# Patient Record
Sex: Male | Born: 1960 | Race: White | Hispanic: No | Marital: Married | Smoking: Former smoker
Health system: Southern US, Community
[De-identification: ages and names within clinical notes are randomized; demographics above are authoritative.]

## PROBLEM LIST (undated history)

## (undated) DIAGNOSIS — I10 Essential (primary) hypertension: Secondary | ICD-10-CM

## (undated) DIAGNOSIS — IMO0002 Reserved for concepts with insufficient information to code with codable children: Secondary | ICD-10-CM

## (undated) DIAGNOSIS — M722 Plantar fascial fibromatosis: Secondary | ICD-10-CM

## (undated) DIAGNOSIS — G610 Guillain-Barre syndrome: Secondary | ICD-10-CM

## (undated) DIAGNOSIS — K219 Gastro-esophageal reflux disease without esophagitis: Secondary | ICD-10-CM

## (undated) HISTORY — DX: Essential (primary) hypertension: I10

## (undated) HISTORY — DX: Guillain-Barre syndrome: G61.0

## (undated) HISTORY — DX: Plantar fascial fibromatosis: M72.2

## (undated) HISTORY — DX: Reserved for concepts with insufficient information to code with codable children: IMO0002

## (undated) HISTORY — DX: Gastro-esophageal reflux disease without esophagitis: K21.9

## (undated) HISTORY — PX: OTHER SURGICAL HISTORY: SHX169

---
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
## 2001-09-16 ENCOUNTER — Encounter: Payer: Self-pay | Admitting: Emergency Medicine

## 2001-09-16 ENCOUNTER — Emergency Department (HOSPITAL_COMMUNITY): Admission: AC | Admit: 2001-09-16 | Discharge: 2001-09-16 | Payer: Self-pay

## 2011-08-23 ENCOUNTER — Emergency Department (HOSPITAL_COMMUNITY)
Admission: EM | Admit: 2011-08-23 | Discharge: 2011-08-23 | Disposition: A | Discharge status: Home / Self Care | Payer: Self-pay | Attending: Emergency Medicine | Admitting: Emergency Medicine

## 2011-08-23 ENCOUNTER — Encounter: Payer: Self-pay | Admitting: Emergency Medicine

## 2011-08-23 DIAGNOSIS — K529 Noninfective gastroenteritis and colitis, unspecified: Secondary | ICD-10-CM

## 2011-08-23 DIAGNOSIS — K625 Hemorrhage of anus and rectum: Secondary | ICD-10-CM | POA: Insufficient documentation

## 2011-08-23 DIAGNOSIS — E876 Hypokalemia: Secondary | ICD-10-CM | POA: Insufficient documentation

## 2011-08-23 DIAGNOSIS — K5289 Other specified noninfective gastroenteritis and colitis: Secondary | ICD-10-CM | POA: Insufficient documentation

## 2011-08-23 DIAGNOSIS — R509 Fever, unspecified: Secondary | ICD-10-CM | POA: Insufficient documentation

## 2011-08-23 DIAGNOSIS — Z79899 Other long term (current) drug therapy: Secondary | ICD-10-CM | POA: Insufficient documentation

## 2011-08-23 DIAGNOSIS — R197 Diarrhea, unspecified: Secondary | ICD-10-CM | POA: Insufficient documentation

## 2011-08-23 LAB — DIFFERENTIAL
Basophils Relative: 0 % (ref 0–1)
Monocytes Absolute: 0.7 10*3/uL (ref 0.1–1.0)
Monocytes Relative: 13 % — ABNORMAL HIGH (ref 3–12)
Neutro Abs: 3.6 10*3/uL (ref 1.7–7.7)

## 2011-08-23 LAB — CBC
HCT: 40.1 % (ref 39.0–52.0)
Hemoglobin: 13.8 g/dL (ref 13.0–17.0)
MCH: 29.7 pg (ref 26.0–34.0)
MCHC: 34.4 g/dL (ref 30.0–36.0)
MCV: 86.4 fL (ref 78.0–100.0)
Platelets: 188 10*3/uL (ref 150–400)
RBC: 4.64 MIL/uL (ref 4.22–5.81)
RDW: 13 % (ref 11.5–15.5)
WBC: 5.6 10*3/uL (ref 4.0–10.5)

## 2011-08-23 LAB — BASIC METABOLIC PANEL
BUN: 11 mg/dL (ref 6–23)
CO2: 27 mEq/L (ref 19–32)
Chloride: 98 mEq/L (ref 96–112)
Creatinine, Ser: 0.73 mg/dL (ref 0.50–1.35)
GFR calc Af Amer: >90 mL/min (ref >90)

## 2011-08-23 MED ORDER — ONDANSETRON HCL 4 MG/2ML IJ SOLN
4.0000 mg | Freq: Once | INTRAMUSCULAR | Status: DC
  Filled 2011-08-23: qty 2

## 2011-08-23 MED ORDER — SODIUM CHLORIDE 0.9 % IV BOLUS (SEPSIS)
1000.0000 mL | Freq: Once | INTRAVENOUS | Status: AC
  Administered 2011-08-23: 1000 mL via INTRAVENOUS

## 2011-08-23 MED ORDER — POTASSIUM CHLORIDE CRYS ER 20 MEQ PO TBCR
20.0000 meq | EXTENDED_RELEASE_TABLET | Freq: Once | ORAL | Status: AC
  Administered 2011-08-23: 20 meq via ORAL
  Filled 2011-08-23: qty 1

## 2011-08-23 MED ORDER — DIPHENOXYLATE-ATROPINE 2.5-0.025 MG PO TABS: 2.0000 | ORAL_TABLET | Freq: Four times a day (QID) | ORAL | Status: AC | PRN

## 2011-08-23 MED ORDER — DIPHENOXYLATE-ATROPINE 2.5-0.025 MG PO TABS
2.0000 | ORAL_TABLET | Freq: Once | ORAL | Status: AC
  Administered 2011-08-23: 2 via ORAL
  Filled 2011-08-23: qty 2

## 2011-08-23 NOTE — ED Provider Notes (Signed)
History     CSN: [id number] Arrival date & time: 08/23/2011  8:41 AM  Chief Complaint  Patient presents with  . Rectal Bleeding  . Diarrhea  . Fever  . Chills    (Consider location/radiation/quality/duration/timing/severity/associated sxs/prior treatment) Patient is a 50 y.o. male presenting with hematochezia, diarrhea, and fever. The history is provided by the patient and the spouse.  Rectal Bleeding  The onset was gradual. The problem occurs frequently (He reports frequent small volume brown liquid diarrhea (occuring about every 15 minutes) along with left lower abdominal cramping for the past 5 days.  He has had intermittent fever,  reporting fever to 104 yesterday. ). The problem has been unchanged. The pain is mild. The stool is described as liquid (since last night has seen a few episodes of blood clots in his stool.). Associated symptoms include a fever and diarrhea. Pertinent negatives include no abdominal pain, no hematemesis, no nausea, no rectal pain, no vomiting, no chest pain, no headaches and no rash. Associated symptoms comments: He reports being hungry,  But has been reluctant to eat as this will trigger diarrhea.  He has been maintaining adequate fluid intake with sports drinks.. The diarrhea occurs more than 10 times per day. The diarrhea is watery. His past medical history does not include abdominal surgery, inflammatory bowel disease, recent abdominal injury, recent antibiotic use, recent change in diet or a recent illness. There were no sick contacts. He has received no recent medical care.  Diarrhea The primary symptoms include fever, diarrhea and hematochezia. Primary symptoms do not include abdominal pain, nausea, vomiting, hematemesis, arthralgias or rash.  Associated medical issues do not include inflammatory bowel disease.  Fever Primary symptoms of the febrile illness include fever and diarrhea. Primary symptoms do not include headaches, shortness of breath, abdominal  pain, nausea, vomiting, arthralgias or rash.    History reviewed. No pertinent past medical history.  History reviewed. No pertinent past surgical history.  History reviewed. No pertinent family history.  History  Substance Use Topics  . Smoking status: Not on file  . Smokeless tobacco: Not on file  . Alcohol Use: No      Review of Systems  Constitutional: Positive for fever.  HENT: Negative for congestion, sore throat and neck pain.   Eyes: Negative.   Respiratory: Negative for chest tightness and shortness of breath.   Cardiovascular: Negative for chest pain.  Gastrointestinal: Positive for diarrhea and hematochezia. Negative for nausea, vomiting, abdominal pain, rectal pain and hematemesis.  Genitourinary: Negative.   Musculoskeletal: Negative for joint swelling and arthralgias.  Skin: Negative.  Negative for rash and wound.  Neurological: Negative for dizziness, weakness, light-headedness, numbness and headaches.  Hematological: Negative.   Psychiatric/Behavioral: Negative.     Allergies  Review of patient's allergies indicates no known allergies.  Home Medications   Current Outpatient Rx  Name Route Sig Dispense Refill  . DM-PHENYLEPHRINE-ACETAMINOPHEN 10-5-325 MG/15ML PO LIQD Oral Take 15 mLs by mouth 2 (two) times daily as needed. Cold Symptoms     . ECHINACEA EXTRACT PO Oral Take 1 capsule by mouth daily as needed. Used to try help cold symptoms     . IBUPROFEN 200 MG PO TABS Oral Take 600-800 mg by mouth every 6 (six) hours as needed. Pain     . DIPHENOXYLATE-ATROPINE 2.5-0.025 MG PO TABS Oral Take 2 tablets by mouth 4 (four) times daily as needed for diarrhea/loose stools. 12 tablet 0    BP 128/85  Pulse 84  Temp(Src) 98.6 F (  37 C) (Oral)  Resp 20  Ht 5\' 9"  (1.753 m)  Wt 195 lb (88.451 kg)  BMI 28.80 kg/m2  SpO2 95%  Physical Exam  Nursing note and vitals reviewed. Constitutional: He is oriented to person, place, and time. He appears well-developed  and well-nourished.  HENT:  Head: Normocephalic and atraumatic.  Eyes: Conjunctivae are normal.  Neck: Normal range of motion.  Cardiovascular: Normal rate, regular rhythm, normal heart sounds and intact distal pulses.   Pulmonary/Chest: Effort normal and breath sounds normal. He has no wheezes.  Abdominal: Soft. Bowel sounds are normal. He exhibits no distension. There is tenderness. There is no rebound and no guarding.       Slight discomfort llq without guarding or rebound.  Musculoskeletal: Normal range of motion.  Neurological: He is alert and oriented to person, place, and time.  Skin: Skin is warm and dry.  Psychiatric: He has a normal mood and affect.    ED Course  Procedures (including critical care time)  Labs Reviewed  DIFFERENTIAL - Abnormal; Notable for the following:    Monocytes Relative 13 (*)    All other components within normal limits  BASIC METABOLIC PANEL - Abnormal; Notable for the following:    Sodium 133 (*)    Potassium 3.2 (*)    Glucose, Bld 129 (*)    All other components within normal limits  CBC  CLOSTRIDIUM DIFFICILE EIA  STOOL CULTURE  OVA AND PARASITE EXAMINATION   No results found.   1. Hypokalemia, gastrointestinal losses   2. Enteritis       MDM  O & P,  Stool cultures pending, c dif pending.  Lomotil prn diarrhea.  Patient without further diarrhea while in dept after initial sample for lab testing obtained.  IV NS 1 liter given,  Patient felt better at time of dc.  Encourage close f/u.  If still with sx in 48 hours,  Patient will call for culture results and/or possible re-eval here vs abx called in given positive cultures. B.r.a.t. Diet for the next 24 hours.        Candis Musa, PA 08/23/11 1705

## 2011-08-23 NOTE — ED Notes (Signed)
Pt c/o diarrhea since Friday. States that it had blood in it last night. C/o soreness in his lower abdomen. Denies nausea and vomiting. Pt had a small dark Patrick Wood BM with mucous but no blood seen. Alert and oriented x 3. Skin warm and dry. Color pink. Breath sounds clear and equal bilaterally. Abdomen soft and non distended. Denies tenderness. Also c/o red rash on pubic area.

## 2011-08-23 NOTE — ED Notes (Signed)
Pt c/o fever, diarrhea and rectal bleeding since Friday.

## 2011-08-24 LAB — OVA AND PARASITE EXAMINATION

## 2011-08-25 NOTE — ED Provider Notes (Signed)
Medical screening examination/treatment/procedure(s) were performed by non-physician practitioner and as supervising physician I was immediately available for consultation/collaboration.  Nicholes Stairs, MD 08/25/11 [phone]

## 2011-08-26 NOTE — ED Notes (Signed)
Unable to reach pt per phone to see if pt improved (per Dr Ethelda Chick); left call back number.

## 2011-08-26 NOTE — ED Notes (Signed)
Lab called + salamella on stool specimen. Chart sent to EDP office for review

## 2011-09-14 LAB — STOOL CULTURE

## 2017-07-03 ENCOUNTER — Encounter: Payer: Self-pay | Admitting: Physician Assistant

## 2017-07-03 ENCOUNTER — Ambulatory Visit (INDEPENDENT_AMBULATORY_CARE_PROVIDER_SITE_OTHER): Payer: BLUE CROSS/BLUE SHIELD | Admitting: Physician Assistant

## 2017-07-03 VITALS — BP 128/90 | HR 96 | Temp 98.6°F | Resp 14 | Ht 70.0 in | Wt 201.0 lb

## 2017-07-03 DIAGNOSIS — R1310 Dysphagia, unspecified: Secondary | ICD-10-CM | POA: Diagnosis not present

## 2017-07-03 DIAGNOSIS — K219 Gastro-esophageal reflux disease without esophagitis: Secondary | ICD-10-CM | POA: Diagnosis not present

## 2017-07-03 DIAGNOSIS — Z8042 Family history of malignant neoplasm of prostate: Secondary | ICD-10-CM

## 2017-07-03 DIAGNOSIS — R319 Hematuria, unspecified: Secondary | ICD-10-CM | POA: Diagnosis not present

## 2017-07-03 LAB — COMPREHENSIVE METABOLIC PANEL
ALK PHOS: 52 U/L (ref 39–117)
ALT: 57 U/L — ABNORMAL HIGH (ref 0–53)
AST: 27 U/L (ref 0–37)
Albumin: 4.6 g/dL (ref 3.5–5.2)
BUN: 16 mg/dL (ref 6–23)
CALCIUM: 9.8 mg/dL (ref 8.4–10.5)
CO2: 28 meq/L (ref 19–32)
Chloride: 103 mEq/L (ref 96–112)
Creatinine, Ser: 0.93 mg/dL (ref 0.40–1.50)
GFR: 89.3 mL/min (ref >60.00)
GLUCOSE: 157 mg/dL — AB (ref 70–99)
POTASSIUM: 3.7 meq/L (ref 3.5–5.1)
Sodium: 140 mEq/L (ref 135–145)
TOTAL PROTEIN: 7 g/dL (ref 6.0–8.3)
Total Bilirubin: 1 mg/dL (ref 0.2–1.2)

## 2017-07-03 LAB — CBC WITH DIFFERENTIAL/PLATELET
Basophils Absolute: 0 10*3/uL (ref 0.0–0.1)
Basophils Relative: 0.7 % (ref 0.0–3.0)
EOS PCT: 8.2 % — AB (ref 0.0–5.0)
Eosinophils Absolute: 0.4 10*3/uL (ref 0.0–0.7)
HCT: 46 % (ref 39.0–52.0)
Hemoglobin: 15.7 g/dL (ref 13.0–17.0)
LYMPHS ABS: 1.6 10*3/uL (ref 0.7–4.0)
Lymphocytes Relative: 32.5 % (ref 12.0–46.0)
MCHC: 34 g/dL (ref 30.0–36.0)
MCV: 89.4 fl (ref 78.0–100.0)
MONOS PCT: 7.5 % (ref 3.0–12.0)
Monocytes Absolute: 0.4 10*3/uL (ref 0.1–1.0)
NEUTROS ABS: 2.5 10*3/uL (ref 1.4–7.7)
NEUTROS PCT: 51.1 % (ref 43.0–77.0)
PLATELETS: 221 10*3/uL (ref 150.0–400.0)
RBC: 5.15 Mil/uL (ref 4.22–5.81)
RDW: 13.1 % (ref 11.5–15.5)
WBC: 4.9 10*3/uL (ref 4.0–10.5)

## 2017-07-03 LAB — PSA: PSA: 2.87 ng/mL (ref 0.10–4.00)

## 2017-07-03 LAB — POCT URINALYSIS DIPSTICK
Bilirubin, UA: NEGATIVE
Blood, UA: NEGATIVE
Nitrite, UA: NEGATIVE
PH UA: 7 (ref 5.0–8.0)
PROTEIN UA: NEGATIVE
SPEC GRAV UA: 1.015 (ref 1.010–1.025)
UROBILINOGEN UA: 0.2 U/dL

## 2017-07-03 LAB — LIPASE: LIPASE: 41 U/L (ref 11.0–59.0)

## 2017-07-03 LAB — H. PYLORI ANTIBODY, IGG: H Pylori IgG: NEGATIVE

## 2017-07-03 MED ORDER — PANTOPRAZOLE SODIUM 40 MG PO TBEC: 40.0000 mg | DELAYED_RELEASE_TABLET | Freq: Every day | ORAL | 3 refills | Status: DC

## 2017-07-03 NOTE — Progress Notes (Signed)
Patient presents to clinic today to establish care.  Acute Concerns: Endorses a couple of episodes of hematuria occurring 2 weeks ago. Denies dysuria, urinary urgency or frequency. Endorses slowed stream with hesitancy over the past 1-2 years. Denies post-void dribbling. Endorses nocturia x 1. Patient with + family history of prostate cancer --brother diagnosed at age 56.   Chronic Issues: GERD -- Patient with a long-standing history (5+ years). Symptoms include -- heart burn, reflux, bloating, abdominal cramping, nausea (with certain meals). Does note chest pressure or discomfort with eating larger meals. Notes some occasional dysphagia with solid foods. Denies change in bowel habits. Endorses being regular with bowel movements. Denies melena, hematochezia or tenesmus.   Health Maintenance: Immunizations -- Will obtain records from previous PCP for review.  Colonoscopy -- Has never had. Denies family history of colorectal cancer.   Past Medical History:  Diagnosis Date  . GERD (gastroesophageal reflux disease)    Past Surgical History:  Procedure Laterality Date  . WRIST FRACTURE SURGERY     No current outpatient prescriptions on file prior to visit.   No current facility-administered medications on file prior to visit.     No Known Allergies  Family History  Problem Relation Age of Onset  . Cancer Mother        Liver  . Hypertension Mother   . Asthma Mother   . Cancer Brother 58       Prostate    Social History   Social History  . Marital status: Married    Spouse name: N/A  . Number of children: N/A  . Years of education: N/A   Occupational History  . Not on file.   Social History Main Topics  . Smoking status: Never Smoker  . Smokeless tobacco: Never Used  . Alcohol use No  . Drug use: No  . Sexual activity: Yes   Other Topics Concern  . Not on file   Social History Narrative  . No narrative on file   Review of Systems  Constitutional: Positive  for weight loss. Negative for fever.  Eyes: Negative for blurred vision and double vision.  Respiratory: Negative for shortness of breath.   Cardiovascular: Negative for chest pain and palpitations.  Gastrointestinal: Positive for abdominal pain, heartburn and nausea. Negative for blood in stool, constipation, diarrhea, melena and vomiting.  Genitourinary: Positive for hematuria. Negative for dysuria, flank pain, frequency and urgency.       Nocturia x 1.  + urinary hesitancy  Neurological: Negative for dizziness.  Psychiatric/Behavioral: Negative for depression and memory loss. The patient does not have insomnia.    BP 128/90   Pulse 96   Temp 98.6 F (37 C) (Oral)   Resp 14   Ht 5\' 10"  (1.778 m)   Wt 201 lb (91.2 kg)   SpO2 94%   BMI 28.84 kg/m   Physical Exam  Constitutional: He is oriented to person, place, and time and well-developed, well-nourished, and in no distress.  HENT:  Head: Normocephalic and atraumatic.  Eyes: Conjunctivae are normal.  Cardiovascular: Normal rate, regular rhythm, normal heart sounds and intact distal pulses.   Pulmonary/Chest: Effort normal and breath sounds normal. No respiratory distress. He has no wheezes. He has no rales. He exhibits no tenderness.  Abdominal: Soft. Bowel sounds are normal. He exhibits no distension and no mass. There is no tenderness. There is no rebound and no guarding.  Genitourinary: Rectum normal. Prostate is enlarged. Prostate is not tender.  Neurological: He is  alert and oriented to person, place, and time.  Skin: Skin is warm and dry. No rash noted.  Vitals reviewed.  Assessment/Plan: Hematuria One potential episode a few weeks prior. Otherwise asymptomatic. Urine dip unremarkable for blood/hemoglobin. Will are assessing PSA today giving enlarged PSA on exam and symptoms of BPH. Will likely get urology referral once results are in giving family history.   Gastroesophageal reflux disease without esophagitis With some  episodes of esophagitis. Seems to be concern for a stricture as well. Will start Protonix. Labs obtained today to include H. Pylori testing. GERD diet to be started. Referral to GI placed.   Dysphagia With GERd. Solids only. Occasional. Concern for stricture. Referral to GI placed for EGD and further assessment. Discussed diet. Patient started on PPI for GERD symptoms.   Family history of prostate cancer Patient with urinary hesitancy. DRE performed today revealing enlargement of prostate without loss of central groove. PSA to be obtained today. Will make appropriate referral to Urology if indicated. Will also consider addition of Flomax if workup unremarkable.     Piedad ClimesMartin, Cartez Mogle Cody, PA-C

## 2017-07-03 NOTE — Assessment & Plan Note (Signed)
With some episodes of esophagitis. Seems to be concern for a stricture as well. Will start Protonix. Labs obtained today to include H. Pylori testing. GERD diet to be started. Referral to GI placed.

## 2017-07-03 NOTE — Assessment & Plan Note (Signed)
One potential episode a few weeks prior. Otherwise asymptomatic. Urine dip unremarkable for blood/hemoglobin. Will are assessing PSA today giving enlarged PSA on exam and symptoms of BPH. Will likely get urology referral once results are in giving family history.

## 2017-07-03 NOTE — Assessment & Plan Note (Signed)
Patient with urinary hesitancy. DRE performed today revealing enlargement of prostate without loss of central groove. PSA to be obtained today. Will make appropriate referral to Urology if indicated. Will also consider addition of Flomax if workup unremarkable.

## 2017-07-03 NOTE — Progress Notes (Signed)
Pre visit review using our clinic review tool, if applicable. No additional management support is needed unless otherwise documented below in the visit note. 

## 2017-07-03 NOTE — Patient Instructions (Addendum)
Please go to the lab for blood work. I will call with your results.  Please start the Protonix to help with symptoms. It will take a few days to take a fuller effect. Start an over-the-counter Zantac twice daily for a couple of days to help in the mean time. Please follow the diet below.   I am setting you up with Gastroenterology for a more in-depth assessment of swallowing issues and to help with other gastric issues if labs are unremarkable.   I am also checking prostate tests today giving your current symptoms and family history.  We will schedule a follow-up when I call with lab results.   Food Choices for Gastroesophageal Reflux Disease, Adult When you have gastroesophageal reflux disease (GERD), the foods you eat and your eating habits are very important. Choosing the right foods can help ease your discomfort. What guidelines do I need to follow?  Choose fruits, vegetables, whole grains, and low-fat dairy products.  Choose low-fat meat, fish, and poultry.  Limit fats such as oils, salad dressings, butter, nuts, and avocado.  Keep a food diary. This helps you identify foods that cause symptoms.  Avoid foods that cause symptoms. These may be different for everyone.  Eat small meals often instead of 3 large meals a day.  Eat your meals slowly, in a place where you are relaxed.  Limit fried foods.  Cook foods using methods other than frying.  Avoid drinking alcohol.  Avoid drinking large amounts of liquids with your meals.  Avoid bending over or lying down until 2-3 hours after eating. What foods are not recommended? These are some foods and drinks that may make your symptoms worse: Vegetables Tomatoes. Tomato juice. Tomato and spaghetti sauce. Chili peppers. Onion and garlic. Horseradish. Fruits Oranges, grapefruit, and lemon (fruit and juice). Meats High-fat meats, fish, and poultry. This includes hot dogs, ribs, ham, sausage, salami, and bacon. Dairy Whole milk  and chocolate milk. Sour cream. Cream. Butter. Ice cream. Cream cheese. Drinks Coffee and tea. Bubbly (carbonated) drinks or energy drinks. Condiments Hot sauce. Barbecue sauce. Sweets/Desserts Chocolate and cocoa. Donuts. Peppermint and spearmint. Fats and Oils High-fat foods. This includes Jamaica[place] fries and potato chips. Other Vinegar. Strong spices. This includes black pepper, white pepper, red pepper, cayenne, curry powder, cloves, ginger, and chili powder. The items listed above may not be a complete list of foods and drinks to avoid. Contact your dietitian for more information. This information is not intended to replace advice given to you by your health care provider. Make sure you discuss any questions you have with your health care provider. Document Released: 05/07/2012 Document Revised: 04/13/2016 Document Reviewed: 09/10/2013 Elsevier Interactive Patient Education  2017 [employer]

## 2017-07-03 NOTE — Assessment & Plan Note (Signed)
With GERd. Solids only. Occasional. Concern for stricture. Referral to GI placed for EGD and further assessment. Discussed diet. Patient started on PPI for GERD symptoms.

## 2017-07-04 ENCOUNTER — Encounter: Payer: Self-pay | Admitting: Gastroenterology

## 2017-07-04 ENCOUNTER — Other Ambulatory Visit: Payer: Self-pay | Admitting: Physician Assistant

## 2017-07-04 DIAGNOSIS — K219 Gastro-esophageal reflux disease without esophagitis: Secondary | ICD-10-CM

## 2017-07-04 DIAGNOSIS — Z8042 Family history of malignant neoplasm of prostate: Secondary | ICD-10-CM

## 2017-07-04 DIAGNOSIS — N138 Other obstructive and reflux uropathy: Secondary | ICD-10-CM

## 2017-07-04 DIAGNOSIS — R1310 Dysphagia, unspecified: Secondary | ICD-10-CM

## 2017-07-04 DIAGNOSIS — N401 Enlarged prostate with lower urinary tract symptoms: Secondary | ICD-10-CM

## 2017-07-20 ENCOUNTER — Other Ambulatory Visit: Payer: Self-pay | Admitting: Urology

## 2017-07-20 ENCOUNTER — Ambulatory Visit (INDEPENDENT_AMBULATORY_CARE_PROVIDER_SITE_OTHER): Payer: BLUE CROSS/BLUE SHIELD | Admitting: Urology

## 2017-07-20 DIAGNOSIS — R310 Gross hematuria: Secondary | ICD-10-CM

## 2017-07-20 DIAGNOSIS — R972 Elevated prostate specific antigen [PSA]: Secondary | ICD-10-CM

## 2017-07-30 ENCOUNTER — Ambulatory Visit: Payer: BLUE CROSS/BLUE SHIELD | Admitting: Physician Assistant

## 2017-08-13 ENCOUNTER — Encounter: Payer: Self-pay | Admitting: Gastroenterology

## 2017-08-13 ENCOUNTER — Ambulatory Visit (INDEPENDENT_AMBULATORY_CARE_PROVIDER_SITE_OTHER): Payer: BLUE CROSS/BLUE SHIELD | Admitting: Gastroenterology

## 2017-08-13 VITALS — BP 136/78 | HR 76 | Ht 69.0 in | Wt 206.1 lb

## 2017-08-13 DIAGNOSIS — K219 Gastro-esophageal reflux disease without esophagitis: Secondary | ICD-10-CM

## 2017-08-13 DIAGNOSIS — Z1211 Encounter for screening for malignant neoplasm of colon: Secondary | ICD-10-CM
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
## 2017-08-13 NOTE — Patient Instructions (Signed)
If you are age 56 or older, your body mass index should be between 23-30. Your Body mass index is 30.44 kg/m. If this is out of the aforementioned range listed, please consider follow up with your Primary Care Provider.  If you are age 28 or younger, your body mass index should be between 19-25. Your Body mass index is 30.44 kg/m. If this is out of the aformentioned range listed, please consider follow up with your Primary Care Provider.   We have sent the following medications to your pharmacy for you to pick up at your convenience:  Pantoprazole  You have been scheduled for an endoscopy. Please follow written instructions given to you at your visit today. If you use inhalers (even only as needed), please bring them with you on the day of your procedure. Your physician has requested that you go to www.startemmi.com and enter the access code given to you at your visit today. This web site gives a general overview about your procedure. However, you should still follow specific instructions given to you by our office regarding your preparation for the procedure.  Thank you.

## 2017-08-21 ENCOUNTER — Encounter: Payer: Self-pay | Admitting: Gastroenterology

## 2017-08-28 ENCOUNTER — Telehealth: Payer: Self-pay | Admitting: Gastroenterology

## 2017-08-29 MED ORDER — PANTOPRAZOLE SODIUM 40 MG PO TBEC: 40.0000 mg | DELAYED_RELEASE_TABLET | Freq: Every day | ORAL | 1 refills | Status: DC

## 2017-08-29 NOTE — Telephone Encounter (Signed)
done

## 2017-08-29 NOTE — Telephone Encounter (Signed)
Refill request for pantoprazole 40 mg one a day. 08-13-2017 last seen.

## 2017-08-29 NOTE — Addendum Note (Signed)
Addended by: Charlie Pitter on: 08/29/2017 08:00 PM   Modules accepted: Orders

## 2017-09-04 ENCOUNTER — Encounter: Payer: BLUE CROSS/BLUE SHIELD | Admitting: Gastroenterology

## 2017-09-07 ENCOUNTER — Other Ambulatory Visit: Payer: Self-pay

## 2017-09-07 ENCOUNTER — Telehealth: Payer: Self-pay | Admitting: Gastroenterology

## 2017-09-07 MED ORDER — PANTOPRAZOLE SODIUM 40 MG PO TBEC: 40.0000 mg | DELAYED_RELEASE_TABLET | Freq: Every day | ORAL | 1 refills | Status: DC

## 2017-09-07 NOTE — Telephone Encounter (Signed)
Patient wife states pharmacy never received refill for medication pantoprazole and [facility] [address]

## 2017-09-07 NOTE — Telephone Encounter (Signed)
[facility]

## 2018-06-15 ENCOUNTER — Emergency Department (HOSPITAL_COMMUNITY): Payer: BLUE CROSS/BLUE SHIELD

## 2018-06-15 ENCOUNTER — Inpatient Hospital Stay (HOSPITAL_COMMUNITY)
Admission: EM | Admit: 2018-06-15 | Discharge: 2018-06-19 | DRG: 095 | Disposition: A | Discharge status: Home Health | Payer: BLUE CROSS/BLUE SHIELD | Attending: Internal Medicine | Admitting: Internal Medicine

## 2018-06-15 ENCOUNTER — Other Ambulatory Visit: Payer: Self-pay

## 2018-06-15 ENCOUNTER — Inpatient Hospital Stay (HOSPITAL_COMMUNITY): Payer: BLUE CROSS/BLUE SHIELD

## 2018-06-15 ENCOUNTER — Encounter (HOSPITAL_COMMUNITY): Payer: Self-pay

## 2018-06-15 DIAGNOSIS — Z8249 Family history of ischemic heart disease and other diseases of the circulatory system: Secondary | ICD-10-CM

## 2018-06-15 DIAGNOSIS — Q211 Atrial septal defect: Secondary | ICD-10-CM

## 2018-06-15 DIAGNOSIS — Y844 Aspiration of fluid as the cause of abnormal reaction of the patient, or of later complication, without mention of misadventure at the time of the procedure: Secondary | ICD-10-CM | POA: Diagnosis not present

## 2018-06-15 DIAGNOSIS — E781 Pure hyperglyceridemia: Secondary | ICD-10-CM | POA: Diagnosis not present

## 2018-06-15 DIAGNOSIS — I10 Essential (primary) hypertension: Secondary | ICD-10-CM | POA: Diagnosis present

## 2018-06-15 DIAGNOSIS — Z87891 Personal history of nicotine dependence: Secondary | ICD-10-CM

## 2018-06-15 DIAGNOSIS — R4781 Slurred speech: Secondary | ICD-10-CM | POA: Diagnosis not present

## 2018-06-15 DIAGNOSIS — Z800 Family history of malignant neoplasm of digestive organs: Secondary | ICD-10-CM

## 2018-06-15 DIAGNOSIS — R05 Cough: Secondary | ICD-10-CM | POA: Diagnosis not present

## 2018-06-15 DIAGNOSIS — R2990 Unspecified symptoms and signs involving the nervous system: Secondary | ICD-10-CM | POA: Diagnosis present

## 2018-06-15 DIAGNOSIS — Z8042 Family history of malignant neoplasm of prostate: Secondary | ICD-10-CM

## 2018-06-15 DIAGNOSIS — G610 Guillain-Barre syndrome: Principal | ICD-10-CM | POA: Diagnosis present

## 2018-06-15 DIAGNOSIS — R270 Ataxia, unspecified: Secondary | ICD-10-CM

## 2018-06-15 DIAGNOSIS — K219 Gastro-esophageal reflux disease without esophagitis: Secondary | ICD-10-CM | POA: Diagnosis present

## 2018-06-15 DIAGNOSIS — R42 Dizziness and giddiness: Secondary | ICD-10-CM | POA: Diagnosis not present

## 2018-06-15 DIAGNOSIS — G971 Other reaction to spinal and lumbar puncture: Secondary | ICD-10-CM | POA: Diagnosis not present

## 2018-06-15 DIAGNOSIS — I639 Cerebral infarction, unspecified: Secondary | ICD-10-CM | POA: Diagnosis not present

## 2018-06-15 DIAGNOSIS — R9082 White matter disease, unspecified: Secondary | ICD-10-CM | POA: Diagnosis present

## 2018-06-15 DIAGNOSIS — I6522 Occlusion and stenosis of left carotid artery: Secondary | ICD-10-CM | POA: Diagnosis not present

## 2018-06-15 DIAGNOSIS — G459 Transient cerebral ischemic attack, unspecified: Secondary | ICD-10-CM | POA: Diagnosis not present

## 2018-06-15 DIAGNOSIS — I509 Heart failure, unspecified: Secondary | ICD-10-CM | POA: Diagnosis not present

## 2018-06-15 DIAGNOSIS — R059 Cough, unspecified: Secondary | ICD-10-CM

## 2018-06-15 LAB — CBC
HCT: 44.8 % (ref 39.0–52.0)
Hemoglobin: 15.2 g/dL (ref 13.0–17.0)
MCH: 30.3 pg (ref 26.0–34.0)
MCHC: 33.9 g/dL (ref 30.0–36.0)
MCV: 89.2 fL (ref 78.0–100.0)
PLATELETS: 217 10*3/uL (ref 150–400)
RBC: 5.02 MIL/uL (ref 4.22–5.81)
RDW: 12.3 % (ref 11.5–15.5)
WBC: 5.9 10*3/uL (ref 4.0–10.5)

## 2018-06-15 LAB — I-STAT CHEM 8, ED
BUN: 19 mg/dL (ref 6–20)
CALCIUM ION: 1.16 mmol/L (ref 1.15–1.40)
CREATININE: 1 mg/dL (ref 0.61–1.24)
Chloride: 105 mmol/L (ref 98–111)
Glucose, Bld: 102 mg/dL — ABNORMAL HIGH (ref 70–99)
HEMATOCRIT: 44 % (ref 39.0–52.0)
HEMOGLOBIN: 15 g/dL (ref 13.0–17.0)
Potassium: 3.7 mmol/L (ref 3.5–5.1)
SODIUM: 139 mmol/L (ref 135–145)
TCO2: 24 mmol/L (ref 22–32)

## 2018-06-15 LAB — CSF CELL COUNT WITH DIFFERENTIAL
RBC COUNT CSF: 9 /mm3 — AB
RBC Count, CSF: 17 /mm3 — ABNORMAL HIGH
TUBE #: 1
Tube #: 4
WBC CSF: 1 /mm3 (ref 0–5)
WBC CSF: 2 /mm3 (ref 0–5)

## 2018-06-15 LAB — DIFFERENTIAL
Abs Immature Granulocytes: 0 10*3/uL (ref 0.0–0.1)
BASOS ABS: 0 10*3/uL (ref 0.0–0.1)
BASOS PCT: 1 %
EOS PCT: 4 %
Eosinophils Absolute: 0.3 10*3/uL (ref 0.0–0.7)
Immature Granulocytes: 0 %
Lymphocytes Relative: 34 %
Lymphs Abs: 2 10*3/uL (ref 0.7–4.0)
MONO ABS: 0.4 10*3/uL (ref 0.1–1.0)
MONOS PCT: 7 %
NEUTROS PCT: 54 %
Neutro Abs: 3.1 10*3/uL (ref 1.7–7.7)

## 2018-06-15 LAB — COMPREHENSIVE METABOLIC PANEL
ALT: 73 U/L — ABNORMAL HIGH (ref 0–44)
ANION GAP: 9 (ref 5–15)
AST: 38 U/L (ref 15–41)
Albumin: 4.1 g/dL (ref 3.5–5.0)
Alkaline Phosphatase: 55 U/L (ref 38–126)
BUN: 18 mg/dL (ref 6–20)
CALCIUM: 9.1 mg/dL (ref 8.9–10.3)
CHLORIDE: 106 mmol/L (ref 98–111)
CO2: 23 mmol/L (ref 22–32)
Creatinine, Ser: 0.97 mg/dL (ref 0.61–1.24)
Glucose, Bld: 104 mg/dL — ABNORMAL HIGH (ref 70–99)
Potassium: 3.8 mmol/L (ref 3.5–5.1)
SODIUM: 138 mmol/L (ref 135–145)
Total Bilirubin: 1.2 mg/dL (ref 0.3–1.2)
Total Protein: 7.9 g/dL (ref 6.5–8.1)

## 2018-06-15 LAB — TSH: TSH: 1.962 u[IU]/mL (ref 0.350–4.500)

## 2018-06-15 LAB — VITAMIN B12: Vitamin B-12: 2305 pg/mL — ABNORMAL HIGH (ref 180–914)

## 2018-06-15 LAB — C-REACTIVE PROTEIN: CRP: <0.8 mg/dL (ref <1.0)

## 2018-06-15 LAB — RAPID URINE DRUG SCREEN, HOSP PERFORMED
Amphetamines: NOT DETECTED
BARBITURATES: NOT DETECTED
BENZODIAZEPINES: NOT DETECTED
COCAINE: NOT DETECTED
Opiates: NOT DETECTED
Tetrahydrocannabinol: NOT DETECTED

## 2018-06-15 LAB — SEDIMENTATION RATE: Sed Rate: 7 mm/hr (ref 0–16)

## 2018-06-15 LAB — FOLATE: Folate: 38 ng/mL (ref >5.9)

## 2018-06-15 LAB — PROTIME-INR
INR: 1.03
PROTHROMBIN TIME: 13.4 s (ref 11.4–15.2)

## 2018-06-15 LAB — I-STAT TROPONIN, ED: TROPONIN I, POC: 0 ng/mL (ref 0.00–0.08)

## 2018-06-15 LAB — URINALYSIS, ROUTINE W REFLEX MICROSCOPIC
Bilirubin Urine: NEGATIVE
Glucose, UA: NEGATIVE mg/dL
Hgb urine dipstick: NEGATIVE
Ketones, ur: NEGATIVE mg/dL
LEUKOCYTES UA: NEGATIVE
Nitrite: NEGATIVE
Protein, ur: NEGATIVE mg/dL
SPECIFIC GRAVITY, URINE: 1.011 (ref 1.005–1.030)
pH: 6 (ref 5.0–8.0)

## 2018-06-15 LAB — APTT: APTT: 28 s (ref 24–36)

## 2018-06-15 LAB — PROTEIN, CSF: Total  Protein, CSF: 76 mg/dL — ABNORMAL HIGH (ref 15–45)

## 2018-06-15 LAB — GLUCOSE, CSF: Glucose, CSF: 55 mg/dL (ref 40–70)

## 2018-06-15 MED ORDER — VITAMIN B-12 100 MCG PO TABS
100.0000 ug | ORAL_TABLET | Freq: Every day | ORAL | Status: DC
  Administered 2018-06-16 – 2018-06-19 (×4): 100 ug via ORAL
  Filled 2018-06-15 (×4): qty 1

## 2018-06-15 MED ORDER — IOPAMIDOL (ISOVUE-370) INJECTION 76%
50.0000 mL | Freq: Once | INTRAVENOUS | Status: AC | PRN
  Administered 2018-06-15: 50 mL via INTRAVENOUS

## 2018-06-15 MED ORDER — ASPIRIN 300 MG RE SUPP: 300.0000 mg | Freq: Every day | RECTAL | Status: DC

## 2018-06-15 MED ORDER — ACETAMINOPHEN 325 MG PO TABS
650.0000 mg | ORAL_TABLET | ORAL | Status: DC | PRN
  Administered 2018-06-15 – 2018-06-19 (×5): 650 mg via ORAL
  Filled 2018-06-15 (×5): qty 2

## 2018-06-15 MED ORDER — ACETAMINOPHEN 160 MG/5ML PO SOLN: 650.0000 mg | ORAL | Status: DC | PRN

## 2018-06-15 MED ORDER — STROKE: EARLY STAGES OF RECOVERY BOOK
Freq: Once | Status: AC
  Administered 2018-06-15: 21:00:00
  Filled 2018-06-15: qty 1

## 2018-06-15 MED ORDER — IOPAMIDOL (ISOVUE-370) INJECTION 76%
INTRAVENOUS | Status: AC
  Administered 2018-06-15: 19:00:00
  Filled 2018-06-15: qty 50

## 2018-06-15 MED ORDER — PANTOPRAZOLE SODIUM 40 MG PO TBEC
40.0000 mg | DELAYED_RELEASE_TABLET | Freq: Every day | ORAL | Status: DC
  Administered 2018-06-15 – 2018-06-19 (×5): 40 mg via ORAL
  Filled 2018-06-15 (×5): qty 1

## 2018-06-15 MED ORDER — SODIUM CHLORIDE 0.9 % IV SOLN
INTRAVENOUS | Status: DC
  Administered 2018-06-15 – 2018-06-19 (×6): via INTRAVENOUS

## 2018-06-15 MED ORDER — ACETAMINOPHEN 650 MG RE SUPP: 650.0000 mg | RECTAL | Status: DC | PRN

## 2018-06-15 MED ORDER — ONDANSETRON HCL 4 MG/2ML IJ SOLN: 4.0000 mg | Freq: Three times a day (TID) | INTRAMUSCULAR | Status: DC | PRN

## 2018-06-15 MED ORDER — ZOLPIDEM TARTRATE 5 MG PO TABS
5.0000 mg | ORAL_TABLET | Freq: Every evening | ORAL | Status: DC | PRN
  Administered 2018-06-17: 5 mg via ORAL
  Filled 2018-06-15: qty 1

## 2018-06-15 MED ORDER — GADOBENATE DIMEGLUMINE 529 MG/ML IV SOLN
20.0000 mL | Freq: Once | INTRAVENOUS | Status: AC | PRN
  Administered 2018-06-15: 18 mL via INTRAVENOUS

## 2018-06-15 MED ORDER — SENNOSIDES-DOCUSATE SODIUM 8.6-50 MG PO TABS: 1.0000 | ORAL_TABLET | Freq: Every evening | ORAL | Status: DC | PRN

## 2018-06-15 MED ORDER — ASPIRIN 325 MG PO TABS
325.0000 mg | ORAL_TABLET | Freq: Every day | ORAL | Status: DC
  Administered 2018-06-15 – 2018-06-19 (×5): 325 mg via ORAL
  Filled 2018-06-15 (×5): qty 1
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
## 2018-06-15 NOTE — ED Triage Notes (Signed)
LSN last night 8:30p by wife.  Pt woke up this morning with slurred thick speech, gait imbalance, dizziness.  Onset 6 days ago numbness and tingling in bilateral hands and feet.  Other sensation is intact.  No facial droop, hand grips strong and equal, legs strong and equal.  Pt states when he looks far left or right has double vision.

## 2018-06-15 NOTE — ED Notes (Signed)
RN attempted to give report, RN unavailable  
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
## 2018-06-15 NOTE — ED Notes (Signed)
Pt going to Ct scan
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
## 2018-06-15 NOTE — ED Notes (Signed)
Pt back from MRI at this time

## 2018-06-16 ENCOUNTER — Inpatient Hospital Stay (HOSPITAL_COMMUNITY): Payer: BLUE CROSS/BLUE SHIELD

## 2018-06-16 DIAGNOSIS — I509 Heart failure, unspecified: Secondary | ICD-10-CM

## 2018-06-16 LAB — LIPID PANEL
CHOL/HDL RATIO: 6 ratio
Cholesterol: 157 mg/dL (ref 0–200)
HDL: 26 mg/dL — AB (ref >40)
LDL CALC: 73 mg/dL (ref 0–99)
Triglycerides: 290 mg/dL — ABNORMAL HIGH (ref <150)
VLDL: 58 mg/dL — AB (ref 0–40)

## 2018-06-16 LAB — ECHOCARDIOGRAM COMPLETE
HEIGHTINCHES: 69 in
WEIGHTICAEL: 3347.46 [oz_av]

## 2018-06-16 LAB — HIV ANTIBODY (ROUTINE TESTING W REFLEX): HIV Screen 4th Generation wRfx: NONREACTIVE

## 2018-06-16 LAB — HEMOGLOBIN A1C
HEMOGLOBIN A1C: 5.5 % (ref 4.8–5.6)
Mean Plasma Glucose: 111.15 mg/dL

## 2018-06-16 LAB — RPR: RPR: NONREACTIVE

## 2018-06-16 MED ORDER — IMMUNE GLOBULIN (HUMAN) 20 GM/200ML IV SOLN
400.0000 mg/kg | INTRAVENOUS | Status: DC
  Filled 2018-06-16: qty 400

## 2018-06-16 MED ORDER — ENOXAPARIN SODIUM 40 MG/0.4ML ~~LOC~~ SOLN
40.0000 mg | SUBCUTANEOUS | Status: DC
  Administered 2018-06-16 – 2018-06-18 (×3): 40 mg via SUBCUTANEOUS
  Filled 2018-06-16 (×4): qty 0.4

## 2018-06-16 MED ORDER — PROMETHAZINE HCL 25 MG/ML IJ SOLN: 12.5000 mg | Freq: Four times a day (QID) | INTRAMUSCULAR | Status: DC | PRN

## 2018-06-16 MED ORDER — PROMETHAZINE HCL 25 MG/ML IJ SOLN
12.5000 mg | Freq: Once | INTRAMUSCULAR | Status: AC
  Administered 2018-06-16: 12.5 mg via INTRAVENOUS
  Filled 2018-06-16: qty 1

## 2018-06-16 MED ORDER — IBUPROFEN 200 MG PO TABS
600.0000 mg | ORAL_TABLET | Freq: Four times a day (QID) | ORAL | Status: DC | PRN
  Administered 2018-06-16 – 2018-06-18 (×3): 600 mg via ORAL
  Filled 2018-06-16 (×3): qty 3

## 2018-06-16 MED ORDER — IMMUNE GLOBULIN (HUMAN) 20 GM/200ML IV SOLN
400.0000 mg/kg | INTRAVENOUS | Status: DC
  Administered 2018-06-16 – 2018-06-19 (×4): 40 g via INTRAVENOUS
  Filled 2018-06-16 (×4): qty 400

## 2018-06-16 MED ORDER — MORPHINE SULFATE (PF) 2 MG/ML IV SOLN
2.0000 mg | Freq: Once | INTRAVENOUS | Status: AC
  Administered 2018-06-16: 2 mg via INTRAVENOUS
  Filled 2018-06-16: qty 1

## 2018-06-16 MED ORDER — HYDROCODONE-ACETAMINOPHEN 7.5-325 MG PO TABS
1.0000 | ORAL_TABLET | Freq: Four times a day (QID) | ORAL | Status: DC | PRN
  Administered 2018-06-16 – 2018-06-18 (×4): 1 via ORAL
  Filled 2018-06-16 (×4): qty 1

## 2018-06-16 NOTE — Evaluation (Signed)
Occupational Therapy Evaluation Patient Details Name: Patrick Wood MRN: [id number] DOB: 1961/02/06 Today's Date: 06/16/2018    History of Present Illness Pt is a 57 y.o. male admitted 06/15/18 with stroke-like symptoms. MRI shows multiple small signal abnormalities in cerebral white matter with nonspecific pattern, likely chronic; could be from remote ischemia, demyelination, or infection. PMH includes tobacco use.   Clinical Impression   PTA Pt independent in ADL/mobility. Pt is currently min guard assist for mobility in room (suspect pain medication) and min guard assist for standing ADL at sink. Pt able to don socks independently sitting EOB. Pt also states that his hands and feet at numb and tingling. Pt will benefit from skilled OT in the acute setting to ensure no balance deficits (make sure imbalance was medication related) and to work on BUE function for ADL tasks.     Follow Up Recommendations  No OT follow up;Supervision - Intermittent    Equipment Recommendations  Tub/shower seat    Recommendations for Other Services       Precautions / Restrictions Precautions Precautions: None Restrictions Weight Bearing Restrictions: No      Mobility Bed Mobility Overal bed mobility: Independent                Transfers Overall transfer level: Needs assistance   Transfers: Sit to/from Stand Sit to Stand: Min guard         General transfer comment: braces legs against bed for support     Balance Overall balance assessment: Needs assistance Sitting-balance support: No upper extremity supported;Feet supported Sitting balance-Leahy Scale: Good     Standing balance support: Single extremity supported Standing balance-Leahy Scale: Fair Standing balance comment: 2 LOB during session - suspect medication                           ADL either performed or assessed with clinical judgement   ADL Overall ADL's : Needs assistance/impaired Eating/Feeding:  Modified independent Eating/Feeding Details (indicate cue type and reason): educated if trouble holding onto utensils for prolongued time that he should build up handles Grooming: Oral care;Wash/dry face;Wash/dry hands;Min guard;Minimal assistance;Standing Grooming Details (indicate cue type and reason): one LOB while standing at sink that required OT intervention for balance - Pt able to manipulate all objects - Pt also with decreased proprioception Upper Body Bathing: Supervision/ safety;Sitting   Lower Body Bathing: Supervison/ safety;Sitting/lateral leans   Upper Body Dressing : Supervision/safety   Lower Body Dressing: Supervision/safety;Sitting/lateral leans Lower Body Dressing Details (indicate cue type and reason): to don socks Toilet Transfer: Min guard;Ambulation Toilet Transfer Details (indicate cue type and reason): pushing IV pole Toileting- Clothing Manipulation and Hygiene: Supervision/safety;Sit to/from stand       Functional mobility during ADLs: Min guard;Minimal assistance(pushing IV pole) General ADL Comments: Pt with LOB x2 during session, 1 x able to self correct, one time required therapy assist     Vision Patient Visual Report: No change from baseline       Perception     Praxis      Pertinent Vitals/Pain Pain Assessment: 0-10 Pain Score: 6  Pain Location: Headache Pain Descriptors / Indicators: Headache Pain Intervention(s): Monitored during session;Repositioned;RN gave pain meds during session     Hand Dominance Right   Extremity/Trunk Assessment Upper Extremity Assessment Upper Extremity Assessment: RUE deficits/detail;LUE deficits/detail RUE Deficits / Details: hands tingling, increased time for fine motor movements- but able to complete RUE Sensation: decreased light touch;decreased proprioception RUE Coordination:  decreased fine motor(increased time required) LUE Deficits / Details: hands tingling, increased time for fine motor movements-  but able to complete LUE Sensation: decreased light touch;decreased proprioception LUE Coordination: decreased fine motor(increased time required)   Lower Extremity Assessment Lower Extremity Assessment: Defer to PT evaluation   Cervical / Trunk Assessment Cervical / Trunk Assessment: Normal   Communication Communication Communication: No difficulties   Cognition Arousal/Alertness: Awake/alert Behavior During Therapy: Flat affect Overall Cognitive Status: Within Functional Limits for tasks assessed                                     General Comments  Pt with recent medication for HA, suspect medication for increased dorwsiness    Exercises     Shoulder Instructions      Home Living Family/patient expects to be discharged to:: Private residence Living Arrangements: Spouse/significant other Available Help at Discharge: Family Type of Home: House Home Access: Stairs to enter Secretary/administrator of Steps: 3   Home Layout: One level     Bathroom Shower/Tub: Chief Strategy Officer: Handicapped height     Home Equipment: None          Prior Functioning/Environment Level of Independence: Independent        Comments: Pt self-employed, works in Garment/textile technologist Problem List: Decreased activity tolerance;Impaired balance (sitting and/or standing);Decreased coordination;Impaired sensation;Impaired UE functional use      OT Treatment/Interventions: Self-care/ADL training;Neuromuscular education;Therapeutic activities;Patient/family education;Balance training    OT Goals(Current goals can be found in the care plan section) Acute Rehab OT Goals Patient Stated Goal: to get home OT Goal Formulation: With patient Time For Goal Achievement: 06/30/18 Potential to Achieve Goals: Good ADL Goals Pt Will Perform Grooming: with modified independence;standing Pt Will Transfer to Toilet: with modified independence;ambulating Pt Will  Perform Toileting - Clothing Manipulation and hygiene: with modified independence;sit to/from stand Pt Will Perform Tub/Shower Transfer: Tub transfer;with modified independence;ambulating  OT Frequency: Min 2X/week   Barriers to D/C:            Co-evaluation              AM-PAC PT "6 Clicks" Daily Activity     Outcome Measure Help from another person eating meals?: None Help from another person taking care of personal grooming?: A Little Help from another person toileting, which includes using toliet, bedpan, or urinal?: A Little Help from another person bathing (including washing, rinsing, drying)?: A Little Help from another person to put on and taking off regular upper body clothing?: None Help from another person to put on and taking off regular lower body clothing?: A Little 6 Click Score: 20   End of Session Equipment Utilized During Treatment: Gait belt Nurse Communication: Mobility status  Activity Tolerance: Patient tolerated treatment well Patient left: in bed;with call bell/phone within reach;with bed alarm set;with nursing/sitter in room  OT Visit Diagnosis: Unsteadiness on feet (R26.81);Other abnormalities of gait and mobility (R26.89)                Time: 9629-5284 OT Time Calculation (min): 25 min Charges:  OT General Charges $OT Visit: 1 Visit OT Evaluation $OT Eval Moderate Complexity: 1 Mod OT Treatments $Self Care/Home Management : 8-22 mins  Sherryl Manges OTR/L [phone]  Evern Bio Druanne Bosques 06/16/2018, 2:40 PM

## 2018-06-16 NOTE — Progress Notes (Addendum)
NEURO HOSPITALIST PROGRESS NOTE   Subjective: Patient awake, alert, in bed, NAD on RA. Still has blurry vision with lateral eye movement. Paresthesias still present in Bilateral hands and feet. Slurred speech has not returned. Patient reluctantly agreeable to plan of IVIG for 5 days.  Exam: Vitals:   06/16/18 0639 06/16/18 0639  BP: (!) 155/87 (!) 155/87  Pulse: (!) 58 (!) 58  Resp:  18  Temp: (!) 97.5 F (36.4 C) (!) 97.5 F (36.4 C)  SpO2: 96% 98%    Physical Exam   HEENT-  Normocephalic, no lesions, without obvious abnormality.  Normal external eye and conjunctiva.   Cardiovascular- S1-S2 audible, pulses palpable throughout   Lungs-no rhonchi or wheezing noted, no excessive working breathing.  Saturations within normal limits on RA Extremities- Warm, dry and intact Musculoskeletal-no joint tenderness, deformity or swelling Skin-warm and dry, intact   Neuro:  Mental Status: Alert, oriented, to person/age/month/year/hospital.  Speech fluent without evidence of aphasia.  Able to follow commands without difficulty. Cranial Nerves: II: Visual fields grossly normal,  III,IV, VI: ptosis not present, extra-ocular motions intact bilaterally pupils equal, round, reactive to light and accommodation ( blurry  Vision with lateral eye movement persist) V,VII: smile asymmetric right facial droop at baseline from previous accident,, facial light touch sensation normal bilaterally VIII: hearing normal bilaterally IX,X: uvula rises symmetrically XI: bilateral shoulder shrug XII: midline tongue extension Motor: Right : Upper extremity   5/5    Left:     Upper extremity   5/5  Lower extremity   5/5     Lower extremity   5/5 Tone and bulk:normal tone throughout; no atrophy noted Sensory: Pinprick and light touch intact throughout, bilaterally Deep Tendon Reflexes: diminished throughout Plantars: Right: downgoing   Left: downgoing Cerebellar: normal finger-to-nose,  normal rapid alternating movements and normal heel-to-shin test Gait: wide  Unsteady gait and station. Patient sways when standing in one spot.    Medications:  Scheduled: . aspirin  300 mg Rectal Daily   Or  . aspirin  325 mg Oral Daily  . pantoprazole  40 mg Oral Q1400  . vitamin B-12  100 mcg Oral Daily   Continuous: . sodium chloride 75 mL/hr at 06/15/18 2047  . Immune Globulin 10%     XBJ:[id number]:acetaminophen **OR** acetaminophen (TYLENOL) oral liquid 160 mg/5 mL **OR** acetaminophen, ondansetron (ZOFRAN) IV, senna-docusate, zolpidem  Pertinent Labs/Diagnostics:   Ct Angio Head W Or Wo Contrast  Result Date: 06/15/2018 CLINICAL DATA:  TIA. Thick, slurred speech. Gait imbalance and dizziness. EXAM: CT ANGIOGRAPHY HEAD AND NECK TECHNIQUE: Multidetector CT imaging of the head and neck was performed using the standard protocol during bolus administration of intravenous contrast. Multiplanar CT image reconstructions and MIPs were obtained to evaluate the vascular anatomy. Carotid stenosis measurements (when applicable) are obtained utilizing NASCET criteria, using the distal internal carotid diameter as the denominator. CONTRAST:  50mL ISOVUE-370 IOPAMIDOL (ISOVUE-370) INJECTION 76% COMPARISON:  Brain MRI earlier today FINDINGS: CTA NECK FINDINGS Aortic arch: Normal arch.  Three vessel branching. Right carotid system: Motion artifact at the level of the common carotid. Mild atheromatous wall thickening and calcification at the common carotid bifurcation. No ulceration or stenosis. Left carotid system: Mild partially calcified plaque along the distal common carotid and at the common carotid bifurcation. Vertebral arteries: Mild low-density plaque at the proximal left subclavian. Calcified plaque at the bilateral vertebral  origins with mild narrowing. Vessels are smooth and widely patent to the dura. Skeleton: No acute or aggressive finding. Other neck: No evident mass or inflammation. Upper chest:  Paraseptal emphysema. Review of the MIP images confirms the above findings CTA HEAD FINDINGS Anterior circulation: Vessels are smooth and widely patent. Minimal calcified plaque at the left cavernous sinus. Negative for aneurysm. Posterior circulation: Codominant vertebral arteries. Hypoplastic right P1 segment. No branch occlusion or flow limiting stenosis. Negative for aneurysm. Venous sinuses: Patent Anatomic variants: Proximal basilar fenestration. Delayed phase: No abnormal intracranial enhancement Review of the MIP images confirms the above findings IMPRESSION: 1. No acute finding. 2. Mild atherosclerosis.  No flow limiting stenosis or ulceration. Electronically Signed   By: Marnee Spring M.D.   On: 06/15/2018 19:18   Ct Head Wo Contrast  Result Date: 06/15/2018 CLINICAL DATA:  57 year old male with bilateral extremity numbness and slurred speech for 5 days. EXAM: CT HEAD WITHOUT CONTRAST TECHNIQUE: Contiguous axial images were obtained from the base of the skull through the vertex without intravenous contrast. COMPARISON:  None. FINDINGS: Brain: A few scattered subcortical hypodense areas in the frontoparietal regions bilaterally noted, at least 1 on the RIGHT and 2 on the LEFT. Very mild periventricular white matter hypodensities noted. There is no evidence of hemorrhage, hydrocephalus, mass effect, midline shift, extra-axial collection or cortical infarct. Vascular: No hyperdense vessel or unexpected calcification. Skull: Normal. Negative for fracture or focal lesion. Sinuses/Orbits: No acute finding. Other: None. IMPRESSION: 1. Nonspecific subcortical white matter hypodensities as discussed above. Consider MRI for further evaluation. 2. Very mild periventricular white matter hypodensities, more likely to represent chronic small-vessel white matter ischemic changes Electronically Signed   By: Harmon Pier M.D.   On: 06/15/2018 12:26   Ct Angio Neck W Or Wo Contrast  Result Date: 06/15/2018 CLINICAL  DATA:  TIA. Thick, slurred speech. Gait imbalance and dizziness. EXAM: CT ANGIOGRAPHY HEAD AND NECK TECHNIQUE: Multidetector CT imaging of the head and neck was performed using the standard protocol during bolus administration of intravenous contrast. Multiplanar CT image reconstructions and MIPs were obtained to evaluate the vascular anatomy. Carotid stenosis measurements (when applicable) are obtained utilizing NASCET criteria, using the distal internal carotid diameter as the denominator. CONTRAST:  50mL ISOVUE-370 IOPAMIDOL (ISOVUE-370) INJECTION 76% COMPARISON:  Brain MRI earlier today FINDINGS: CTA NECK FINDINGS Aortic arch: Normal arch.  Three vessel branching. Right carotid system: Motion artifact at the level of the common carotid. Mild atheromatous wall thickening and calcification at the common carotid bifurcation. No ulceration or stenosis. Left carotid system: Mild partially calcified plaque along the distal common carotid and at the common carotid bifurcation. Vertebral arteries: Mild low-density plaque at the proximal left subclavian. Calcified plaque at the bilateral vertebral origins with mild narrowing. Vessels are smooth and widely patent to the dura. Skeleton: No acute or aggressive finding. Other neck: No evident mass or inflammation. Upper chest: Paraseptal emphysema. Review of the MIP images confirms the above findings CTA HEAD FINDINGS Anterior circulation: Vessels are smooth and widely patent. Minimal calcified plaque at the left cavernous sinus. Negative for aneurysm. Posterior circulation: Codominant vertebral arteries. Hypoplastic right P1 segment. No branch occlusion or flow limiting stenosis. Negative for aneurysm. Venous sinuses: Patent Anatomic variants: Proximal basilar fenestration. Delayed phase: No abnormal intracranial enhancement Review of the MIP images confirms the above findings IMPRESSION: 1. No acute finding. 2. Mild atherosclerosis.  No flow limiting stenosis or  ulceration. Electronically Signed   By: Kathrynn Ducking.D.  On: 06/15/2018 19:18   Mr Laqueta Jean And Wo Contrast  Result Date: 06/15/2018 CLINICAL DATA:  Ataxia with stroke suspected EXAM: MRI HEAD WITHOUT AND WITH CONTRAST TECHNIQUE: Multiplanar, multiecho pulse sequences of the brain and surrounding structures were obtained without and with intravenous contrast. CONTRAST:  18mL MULTIHANCE GADOBENATE DIMEGLUMINE 529 MG/ML IV SOLN COMPARISON:  06/15/2018 FINDINGS: Brain: Numerous FLAIR hyperintense foci in the cerebral white matter mainly in the deep and juxta cortical regions. There is no periventricular predilection or infratentorial involvement. No black holes or enhancement. These can be seen with old microvascular insults, demyelinating process, trauma (unlikely in the absence of chronic blood products or cortical gliosis), or remote infectious/inflammatory process. Normal brain volume. No acute infarct, hemorrhage, hydrocephalus, or mass Vascular: Major flow voids and vascular enhancements are preserved Skull and upper cervical spine: No evidence of marrow lesion Sinuses/Orbits: Prominent rightward nasal septal deviation and spurring. IMPRESSION: 1. Multiple small signal abnormalities in the cerebral white matter with nonspecific pattern. Lack of enhancement favors a chronic process. These could be from remote ischemia, demyelination, or infection. 2. No acute finding to explain symptoms. Electronically Signed   By: Marnee Spring M.D.   On: 06/15/2018 16:32   Dg Chest Port 1 View  Result Date: 06/15/2018 CLINICAL DATA:  Cough EXAM: PORTABLE CHEST 1 VIEW COMPARISON:  None. FINDINGS: Heart and mediastinal contours are within normal limits. No focal opacities or effusions. No acute bony abnormality. IMPRESSION: No active disease. Electronically Signed   By: Charlett Nose M.D.   On: 06/15/2018 20:01    Impression:  57 y.o. male with no PMH who present to ED with  Tingling/ dizziness/ gait imbalance/  slurred speech. Has been having parasthesia x 1 week after having a cold. Reflexes appear diminished. MRI brain shows significant white matter disease. LP showed elevated protein. Presentation of bilateral paresthesias, areflexia combined with CSF showing elevated protein favors a diagnosis of Guillain Barre. B12 is high, TSH is normal.  Patient is nondiabetic.  No other explanation to his elevated protein  Bilateral paresthesias and difficulties walking, areflexia favors Guillain-Barr(AIDP)   Recommendations:  -Start IVIG x 5 days ( if possible to set-up as outpatient infusions patient would prefer) Telemetry, watch for autonomic instability PT OT eval  Valentina Lucks, MSN, NP-C [facility] [phone]  Attending neurologist's note to follow   NEUROHOSPITALIST ADDENDUM Seen and examined the patient today. I have reviewed the contents of history and physical exam as documented by PA/ARNP/Resident and agree with above documentation.  I have discussed and formulated the above plan as documented. Edits to the note have been made as needed.   Protein was 76 normal  cell count.  Consistent with albumin cytological dissociation.  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  Discussed risks versus benefits of IVIG and patient consented.  Will receive 5 days of IVIG, need to see if this can be done in outpatient infusion center.  PT OT eval   Georgiana Spinner Aroor MD [facility] [phone]   If 7pm to 7am, please call on call as listed on AMION.   06/16/2018, 7:42 AM

## 2018-06-16 NOTE — Progress Notes (Signed)
PROGRESS NOTE    Patient: Patrick Wood     PCP: Waldon Merl, PA-C                    DOB: [birth year]            DOA: 06/15/2018 ZOX:[id number]             DOS: 06/16/2018, 12:43 PM   LOS: 1 day   Date of Service: The patient was seen and examined on 06/16/2018  Subjective:   Patient was seen and examined this morning, still complaining of weakness and tingling in his lower extremities.  Denies any shortness of breath or chest pain. Otherwise stable this morning.  No complaints no issues overnight. Was seen earlier by neurologist findings are more consistent with Patrick Wood, recommend start IVIG for a total of 5 days, patient is informed --- inquiring if this could be done as an outpatient.   ----------------------------------------------------------------------------------------------------------------------  Brief Narrative:   Patrick Wood is a 57 y.o. male with medical history significant of GERD, former smoker, who presents with strokelike symptoms.  Patient states that he has been having numbness in both hands and feet for about a week.  He also has blurry vision and double vision on looking to the left or right.  He has poor balance.  In this morning he woke up with mild slurred speech, no facial droop, no muscle weakness in legs or arms.  Patient does not have fever or chills.  Patient states that he had upper respiratory symptoms in the past 2 weeks, including productive cough, which has resolved 3 days ago.  Currently patient does not have chest pain, shortness breath, cough, fever or chills.  Patient does not have nausea, vomiting, diarrhea, abdominal pain, dysuria or burning on urination.  On admission was found to have WBC 5.9, INR 1.03, PTT 28, negative UDS, negative urinalysis, electrolytes renal function okay, temperature normal, bradycardia, no tachypnea, oxygen saturation 92% on room air.  CT head showed nonspecific subcortical white matter hypodensities,  very mild periventricular white matter hypodensities, more likely to represent chronic small-vessel white matter ischemic changes MRI showed multiple small signal abnormalities in the cerebral white matter with nonspecific pattern. Lack of enhancement favors a chronic process. These could be from remote ischemia, demyelination, or infection. CTA of head and neck showed no acute issues. Pt is admitted to tele bed as inpt. Neurology, Dr. Laurence Slate was consulted.   Principal Problem:   Stroke-like symptoms Active Problems:   Gastroesophageal reflux disease without esophagitis  Assessment & Plan:   Stroke-like symptoms/Patrick  Barr Wood -Unclear etiology, CT MRI consider with nonspecific changes -TIA/stroke ruled out, neurology following, findings with elevated protein and CSF consistent with history presented more like Patrick Wood, recommended IVIG for total of 5 days    Patient had recent upper respiratory symptoms and decreased tendon reflex,  -LP analysis within normal limits with exception of elevated protein to 76 (normal range 15-45) -Labs:A1c, TSH, LFTs, lipid panel, CRP, B12, folate, sed rate, within normal limits  - 2D transthoracic echocardiography  - PT/OT consult   Gastroesophageal reflux disease without esophagitis: -protonix    DVT ppx: SCD Code Status: Full code Family Communication: Yes, patient's wife   at bed side Disposition Plan:  Anticipate discharge back to previous home environment Consults called:  Dr. Laurence Slate Admission status:   Inpatient/tele      Objective: Vitals:   06/16/18 8119 06/16/18 1478 06/16/18 2956 06/16/18 1228  BP: (!) 155/87 (!) 155/87 (!) 150/94 (!) 129/94  Pulse: (!) 58 (!) 58 62 66  Resp:  18 18   Temp: (!) 97.5 F (36.4 C) (!) 97.5 F (36.4 C) (!) 97.5 F (36.4 C) 97.6 F (36.4 C)  TempSrc: Oral Oral Oral Oral  SpO2: 96% 98% 96% 97%  Weight:      Height:        Intake/Output Summary (Last 24 hours) at  06/16/2018 1243 Last data filed at 06/16/2018 0030 Gross per 24 hour  Intake -  Output 600 ml  Net -600 ml   Filed Weights   06/15/18 2031  Weight: 94.9 kg (209 lb 3.5 oz)    Examination:  General exam: Appears calm and comfortable  Psychiatry: Judgement and insight appear normal. Mood & affect appropriate. HEENT: WNLs Respiratory system: Clear to auscultation. Respiratory effort normal. Cardiovascular system: S1 & S2 heard, RRR. No JVD, murmurs, rubs, gallops or clicks. No pedal edema. Gastrointestinal system: Abd. nondistended, soft and nontender. No organomegaly or masses felt. Normal bowel sounds heard. Central nervous system: Cranial nerves II to XII within normal limits, subjective of right lower extremity paresthesia, strength 5 out of 5 in upper lower extremity, abnormal gait, if any tremors, nose to finger test negative, for any pronator drift, Extremities: Symmetric 5 x 5 power. Skin: No rashes, lesions or ulcers Wounds:   Data Reviewed: I have personally reviewed following labs and imaging studies  CBC: Recent Labs  Lab 06/15/18 1140 06/15/18 1149  WBC 5.9  --   NEUTROABS 3.1  --   HGB 15.2 15.0  HCT 44.8 44.0  MCV 89.2  --   PLT 217  --    Basic Metabolic Panel: Recent Labs  Lab 06/15/18 1140 06/15/18 1149  NA 138 139  K 3.8 3.7  CL 106 105  CO2 23  --   GLUCOSE 104* 102*  BUN 18 19  CREATININE 0.97 1.00  CALCIUM 9.1  --    GFR: Estimated Creatinine Clearance: 92.7 mL/min (by C-G formula based on SCr of 1 mg/dL). Liver Function Tests: Recent Labs  Lab 06/15/18 1140  AST 38  ALT 73*  ALKPHOS 55  BILITOT 1.2  PROT 7.9  ALBUMIN 4.1   No results for input(s): LIPASE, AMYLASE in the last 168 hours. No results for input(s): AMMONIA in the last 168 hours. Coagulation Profile: Recent Labs  Lab 06/15/18 1140  INR 1.03   Cardiac Enzymes: No results for input(s): CKTOTAL, CKMB, CKMBINDEX, TROPONINI in the last 168 hours. BNP (last 3  results) No results for input(s): PROBNP in the last 8760 hours. HbA1C: Recent Labs    06/16/18 0606  HGBA1C 5.5   CBG: No results for input(s): GLUCAP in the last 168 hours. Lipid Profile: Recent Labs    06/16/18 0606  CHOL 157  HDL 26*  LDLCALC 73  TRIG 161*  CHOLHDL 6.0   Thyroid Function Tests: Recent Labs    06/15/18 2108  TSH 1.962   Anemia Panel: Recent Labs    06/15/18 2108  VITAMINB12 2,305*  FOLATE 38.0   Sepsis Labs: No results for input(s): PROCALCITON, LATICACIDVEN in the last 168 hours.  Recent Results (from the past 240 hour(s))  CSF culture     Status: None (Preliminary result)   Collection Time: 06/15/18  7:45 PM  Result Value Ref Range Status   Specimen Description CSF  Final   Special Requests TUBE 2  Final   Gram Stain   Final  WBC PRESENT, PREDOMINANTLY MONONUCLEAR NO ORGANISMS SEEN CYTOSPIN SMEAR    Culture   Final    NO GROWTH < 12 HOURS Performed at [facility], [address] [place], [place] [address]    Report Status PENDING  Incomplete      Radiology Studies: Ct Angio Head W Or Wo Contrast  Result Date: 06/15/2018 CLINICAL DATA:  TIA. Thick, slurred speech. Gait imbalance and dizziness. EXAM: CT ANGIOGRAPHY HEAD AND NECK TECHNIQUE: Multidetector CT imaging of the head and neck was performed using the standard protocol during bolus administration of intravenous contrast. Multiplanar CT image reconstructions and MIPs were obtained to evaluate the vascular anatomy. Carotid stenosis measurements (when applicable) are obtained utilizing NASCET criteria, using the distal internal carotid diameter as the denominator. CONTRAST:  50mL ISOVUE-370 IOPAMIDOL (ISOVUE-370) INJECTION 76% COMPARISON:  Brain MRI earlier today FINDINGS: CTA NECK FINDINGS Aortic arch: Normal arch.  Three vessel branching. Right carotid system: Motion artifact at the level of the common carotid. Mild atheromatous wall thickening and calcification at the  common carotid bifurcation. No ulceration or stenosis. Left carotid system: Mild partially calcified plaque along the distal common carotid and at the common carotid bifurcation. Vertebral arteries: Mild low-density plaque at the proximal left subclavian. Calcified plaque at the bilateral vertebral origins with mild narrowing. Vessels are smooth and widely patent to the dura. Skeleton: No acute or aggressive finding. Other neck: No evident mass or inflammation. Upper chest: Paraseptal emphysema. Review of the MIP images confirms the above findings CTA HEAD FINDINGS Anterior circulation: Vessels are smooth and widely patent. Minimal calcified plaque at the left cavernous sinus. Negative for aneurysm. Posterior circulation: Codominant vertebral arteries. Hypoplastic right P1 segment. No branch occlusion or flow limiting stenosis. Negative for aneurysm. Venous sinuses: Patent Anatomic variants: Proximal basilar fenestration. Delayed phase: No abnormal intracranial enhancement Review of the MIP images confirms the above findings IMPRESSION: 1. No acute finding. 2. Mild atherosclerosis.  No flow limiting stenosis or ulceration. Electronically Signed   By: Marnee SpringJonathon  Watts M.D.   On: 06/15/2018 19:18   Ct Head Wo Contrast  Result Date: 06/15/2018 CLINICAL DATA:  57 year old male with bilateral extremity numbness and slurred speech for 5 days. EXAM: CT HEAD WITHOUT CONTRAST TECHNIQUE: Contiguous axial images were obtained from the base of the skull through the vertex without intravenous contrast. COMPARISON:  None. FINDINGS: Brain: A few scattered subcortical hypodense areas in the frontoparietal regions bilaterally noted, at least 1 on the RIGHT and 2 on the LEFT. Very mild periventricular white matter hypodensities noted. There is no evidence of hemorrhage, hydrocephalus, mass effect, midline shift, extra-axial collection or cortical infarct. Vascular: No hyperdense vessel or unexpected calcification. Skull: Normal.  Negative for fracture or focal lesion. Sinuses/Orbits: No acute finding. Other: None. IMPRESSION: 1. Nonspecific subcortical white matter hypodensities as discussed above. Consider MRI for further evaluation. 2. Very mild periventricular white matter hypodensities, more likely to represent chronic small-vessel white matter ischemic changes Electronically Signed   By: Harmon PierJeffrey  Hu M.D.   On: 06/15/2018 12:26   Ct Angio Neck W Or Wo Contrast  Result Date: 06/15/2018 CLINICAL DATA:  TIA. Thick, slurred speech. Gait imbalance and dizziness. EXAM: CT ANGIOGRAPHY HEAD AND NECK TECHNIQUE: Multidetector CT imaging of the head and neck was performed using the standard protocol during bolus administration of intravenous contrast. Multiplanar CT image reconstructions and MIPs were obtained to evaluate the vascular anatomy. Carotid stenosis measurements (when applicable) are obtained utilizing NASCET criteria, using the distal internal carotid diameter  as the denominator. CONTRAST:  50mL ISOVUE-370 IOPAMIDOL (ISOVUE-370) INJECTION 76% COMPARISON:  Brain MRI earlier today FINDINGS: CTA NECK FINDINGS Aortic arch: Normal arch.  Three vessel branching. Right carotid system: Motion artifact at the level of the common carotid. Mild atheromatous wall thickening and calcification at the common carotid bifurcation. No ulceration or stenosis. Left carotid system: Mild partially calcified plaque along the distal common carotid and at the common carotid bifurcation. Vertebral arteries: Mild low-density plaque at the proximal left subclavian. Calcified plaque at the bilateral vertebral origins with mild narrowing. Vessels are smooth and widely patent to the dura. Skeleton: No acute or aggressive finding. Other neck: No evident mass or inflammation. Upper chest: Paraseptal emphysema. Review of the MIP images confirms the above findings CTA HEAD FINDINGS Anterior circulation: Vessels are smooth and widely patent. Minimal calcified plaque  at the left cavernous sinus. Negative for aneurysm. Posterior circulation: Codominant vertebral arteries. Hypoplastic right P1 segment. No branch occlusion or flow limiting stenosis. Negative for aneurysm. Venous sinuses: Patent Anatomic variants: Proximal basilar fenestration. Delayed phase: No abnormal intracranial enhancement Review of the MIP images confirms the above findings IMPRESSION: 1. No acute finding. 2. Mild atherosclerosis.  No flow limiting stenosis or ulceration. Electronically Signed   By: Marnee Spring M.D.   On: 06/15/2018 19:18   Mr Laqueta Jean And Wo Contrast  Result Date: 06/15/2018 CLINICAL DATA:  Ataxia with stroke suspected EXAM: MRI HEAD WITHOUT AND WITH CONTRAST TECHNIQUE: Multiplanar, multiecho pulse sequences of the brain and surrounding structures were obtained without and with intravenous contrast. CONTRAST:  18mL MULTIHANCE GADOBENATE DIMEGLUMINE 529 MG/ML IV SOLN COMPARISON:  06/15/2018 FINDINGS: Brain: Numerous FLAIR hyperintense foci in the cerebral white matter mainly in the deep and juxta cortical regions. There is no periventricular predilection or infratentorial involvement. No black holes or enhancement. These can be seen with old microvascular insults, demyelinating process, trauma (unlikely in the absence of chronic blood products or cortical gliosis), or remote infectious/inflammatory process. Normal brain volume. No acute infarct, hemorrhage, hydrocephalus, or mass Vascular: Major flow voids and vascular enhancements are preserved Skull and upper cervical spine: No evidence of marrow lesion Sinuses/Orbits: Prominent rightward nasal septal deviation and spurring. IMPRESSION: 1. Multiple small signal abnormalities in the cerebral white matter with nonspecific pattern. Lack of enhancement favors a chronic process. These could be from remote ischemia, demyelination, or infection. 2. No acute finding to explain symptoms. Electronically Signed   By: Marnee Spring M.D.   On:  06/15/2018 16:32   Dg Chest Port 1 View  Result Date: 06/15/2018 CLINICAL DATA:  Cough EXAM: PORTABLE CHEST 1 VIEW COMPARISON:  None. FINDINGS: Heart and mediastinal contours are within normal limits. No focal opacities or effusions. No acute bony abnormality. IMPRESSION: No active disease. Electronically Signed   By: Charlett Nose M.D.   On: 06/15/2018 20:01    Scheduled Meds: . aspirin  300 mg Rectal Daily   Or  . aspirin  325 mg Oral Daily  . enoxaparin (LOVENOX) injection  40 mg Subcutaneous Q24H  . pantoprazole  40 mg Oral Q1400  . vitamin B-12  100 mcg Oral Daily   Continuous Infusions: . sodium chloride 75 mL/hr at 06/16/18 0930  . Immune Globulin 10% 40 g (06/16/18 1229)    Time spent: >25 minutes  Kendell Bane, MD [facility],  Pager [phone]  If 7PM-7AM, please contact night-coverage [email or web address]   Password [email or web address]  06/16/2018, 12:43 PM

## 2018-06-16 NOTE — Evaluation (Signed)
Physical Therapy Evaluation & Discharge Patient Details Name: Patrick Wood MRN: [id number] DOB: February 01, 1961 Today's Date: 06/16/2018   History of Present Illness  Pt is a 57 y.o. male admitted 06/15/18 with stroke-like symptoms. MRI shows multiple small signal abnormalities in cerebral white matter with nonspecific pattern, likely chronic; could be from remote ischemia, demyelination, or infection. PMH includes tobacco use.    Clinical Impression  Patient evaluated by Physical Therapy with no further acute PT needs identified. PTA, pt indep, self-employed, and lives with wife. Today, pt indep with ambulation and ADLs; endorses residual tingling in bilat hands/feet. DGI score of 21/24 indicates decreased fall risk with higher level balance activities. Reviewed stroke symptoms and fall risk reduction. Encouraged continued ambulation during hospital admission. All education has been completed and the patient has no further questions. PT is signing off. Thank you for this referral.    Follow Up Recommendations No PT follow up;Supervision - Intermittent    Equipment Recommendations  None recommended by PT    Recommendations for Other Services       Precautions / Restrictions Precautions Precautions: None Restrictions Weight Bearing Restrictions: No      Mobility  Bed Mobility Overal bed mobility: Independent                Transfers Overall transfer level: Independent                  Ambulation/Gait Ambulation/Gait assistance: Independent Gait Distance (Feet): 350 Feet Assistive device: None;IV Pole       General Gait Details: Indep walking with and without pushing IV pole  Stairs            Wheelchair Mobility    Modified Rankin (Stroke Patients Only) Modified Rankin (Stroke Patients Only) Pre-Morbid Rankin Score: No symptoms Modified Rankin: No significant disability     Balance Overall balance assessment: Needs assistance   Sitting  balance-Leahy Scale: Good       Standing balance-Leahy Scale: Good               High level balance activites: Head turns;Turns;Direction changes;Braiding;Side stepping High Level Balance Comments: Instability with head turns, but able to self-correct Standardized Balance Assessment Standardized Balance Assessment : Dynamic Gait Index   Dynamic Gait Index Level Surface: Normal Change in Gait Speed: Mild Impairment Gait with Horizontal Head Turns: Mild Impairment Gait with Vertical Head Turns: Normal Gait and Pivot Turn: Normal Step Over Obstacle: Normal Step Around Obstacles: Normal Steps: Mild Impairment Total Score: 21       Pertinent Vitals/Pain Pain Assessment: Faces Faces Pain Scale: Hurts little more Pain Location: Headache Pain Descriptors / Indicators: Headache Pain Intervention(s): Monitored during session;Limited activity within patient's tolerance    Home Living Family/patient expects to be discharged to:: Private residence Living Arrangements: Spouse/significant other Available Help at Discharge: Family Type of Home: House Home Access: Stairs to enter     Home Layout: One level Home Equipment: None      Prior Function Level of Independence: Independent         Comments: Pt self-employed, works in Psychologist, counsellingconstruction     Hand Dominance        Extremity/Trunk Assessment   Upper Extremity Assessment Upper Extremity Assessment: Overall WFL for tasks assessed(c/o bilat hand tingling)    Lower Extremity Assessment Lower Extremity Assessment: Overall WFL for tasks assessed(c/o bilat foot tingling)    Cervical / Trunk Assessment Cervical / Trunk Assessment: Normal  Communication   Communication: No difficulties  Cognition  Arousal/Alertness: Awake/alert Behavior During Therapy: Flat affect Overall Cognitive Status: Within Functional Limits for tasks assessed                                        General Comments       Exercises     Assessment/Plan    PT Assessment Patent does not need any further PT services  PT Problem List         PT Treatment Interventions      PT Goals (Current goals can be found in the Care Plan section)  Acute Rehab PT Goals PT Goal Formulation: All assessment and education complete, DC therapy    Frequency     Barriers to discharge        Co-evaluation               AM-PAC PT "6 Clicks" Daily Activity  Outcome Measure Difficulty turning over in bed (including adjusting bedclothes, sheets and blankets)?: None Difficulty moving from lying on back to sitting on the side of the bed? : None Difficulty sitting down on and standing up from a chair with arms (e.g., wheelchair, bedside commode, etc,.)?: None Help needed moving to and from a bed to chair (including a wheelchair)?: None Help needed walking in hospital room?: None Help needed climbing 3-5 steps with a railing? : A Little 6 Click Score: 23    End of Session Equipment Utilized During Treatment: Gait belt Activity Tolerance: Patient tolerated treatment well Patient left: in bed;with call bell/phone within reach;with family/visitor present Nurse Communication: Mobility status PT Visit Diagnosis: Other abnormalities of gait and mobility (R26.89)    Time: 4098-1191 PT Time Calculation (min) (ACUTE ONLY): 14 min   Charges:   PT Evaluation $PT Eval Low Complexity: 1 Low         Patrick Wood, PT, DPT Acute Rehab Services  Pager: [phone]  Patrick Wood 06/16/2018, 10:11 AM

## 2018-06-16 NOTE — Progress Notes (Signed)
  Echocardiogram 2D Echocardiogram has been performed.  Patrick Wood 06/16/2018, 12:33 PM

## 2018-06-17 LAB — HERPES SIMPLEX VIRUS(HSV) DNA BY PCR
HSV 1 DNA: NEGATIVE
HSV 2 DNA: NEGATIVE

## 2018-06-17 NOTE — Progress Notes (Signed)
PROGRESS NOTE    Patient: Patrick Wood     PCP: Waldon MerlMartin, William C, PA-C                    DOB: 1961/03/13            DOA: 06/15/2018 [id number]:[id number]             DOS: 06/17/2018, 1:14 PM   LOS: 2 days   Date of Service: The patient was seen and examined on 06/17/2018  Subjective:   The patient was seen and examined this morning, no acute distress, still complaining of lower upper extremity numbness and tingling, but reported no progression or improvement.  Denies any chest pain or shortness of breath.   No events overnight.  Patient had tolerated IVIG well. Has been complaining of headaches with analgesics.  ------------------------------------------------------------------------------------------------------------------------------------------------  Brief Narrative:   Patrick Colelec D Stones is a 57 y.o. male with medical history significant of GERD, former smoker, who presents with strokelike symptoms.  Patient states that he has been having numbness in both hands and feet for about a week.  He also has blurry vision and double vision on looking to the left or right.  He has poor balance.  In this morning he woke up with mild slurred speech, no facial droop, no muscle weakness in legs or arms.  Patient does not have fever or chills.  Patient states that he had upper respiratory symptoms in the past 2 weeks, including productive cough, which has resolved 3 days ago.  Currently patient does not have chest pain, shortness breath, cough, fever or chills.  Patient does not have nausea, vomiting, diarrhea, abdominal pain, dysuria or burning on urination.  On admission was found to have WBC 5.9, INR 1.03, PTT 28, negative UDS, negative urinalysis, electrolytes renal function okay, temperature normal, bradycardia, no tachypnea, oxygen saturation 92% on room air.  CT head showed nonspecific subcortical white matter hypodensities, very mild periventricular white matter hypodensities, more likely to represent  chronic small-vessel white matter ischemic changes MRI showed multiple small signal abnormalities in the cerebral white matter with nonspecific pattern. Lack of enhancement favors a chronic process. These could be from remote ischemia, demyelination, or infection. CTA of head and neck showed no acute issues. Pt is admitted to tele bed as inpt. Neurology, Dr. Laurence SlateAroor was consulted. 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 Laurence SlateAroor Admission status:   Inpatient/tele      Objective: Vitals:   06/17/18 0000 06/17/18 0309 06/17/18 0857 06/17/18 1157  BP: (!) 132/92 (!) 152/87 (!) 142/82 135/86  Pulse: 75 (!) 54  61 62  Resp: 18 18 16 16   Temp: 97.6 F (36.4 C) 97.7 F (36.5 C) (!) 97.4 F (36.3 C) (!) 97.4 F (36.3 C)  TempSrc:  Oral Oral Oral Oral  SpO2: 99% 96% 98% 96%  Weight:      Height:        Intake/Output Summary (Last 24 hours) at 06/17/2018 1314 Last data filed at 06/17/2018 1100 Gross per 24 hour  Intake 1441.48 ml  Output 1625 ml  Net -183.52 ml   Filed Weights   06/15/18 2031  Weight: 94.9 kg (209 lb 3.5 oz)   BP 135/86 (BP Location: Right Arm)   Pulse 62   Temp (!) 97.4 F (36.3 C) (Oral)   Resp 16   Ht 5\' 9"  (1.753 m)   Wt 94.9 kg (209 lb 3.5 oz)   SpO2 96%   BMI 30.90 kg/m    Physical Exam  Constitution:  Alert, cooperative, no distress,  Psychiatric: Normal and stable mood and affect, cognition intact,   HEENT: Normocephalic, PERRL, otherwise with in Normal limits  Cardio vascular:  S1/S2, RRR, No murmure, No Rubs or Gallops  Chest/pulmonary: Clear to auscultation bilaterally, respirations unlabored  Chest symmetric Abdomen: Soft, non-tender, non-distended, bowel sounds,no masses, no organomegaly Muscular skeletal: Limited exam - in bed, able to move all 4 extremities, Normal strength,  Neuro: CNII-XII intact. , normal motor and sensation, reflexes intact , subjective paresthesia in upper and lower extremities, still have an unsteady gait. Extremities: No pitting edema lower extremities, +2 pulses  Skin: Dry, warm to touch, negative for any Rashes, No open wounds     Data Reviewed: I have personally reviewed following labs and imaging studies  CBC: Recent Labs  Lab 06/15/18 1140 06/15/18 1149  WBC 5.9  --   NEUTROABS 3.1  --   HGB 15.2 15.0  HCT 44.8 44.0  MCV 89.2  --   PLT 217  --    Basic Metabolic Panel: Recent Labs  Lab 06/15/18 1140 06/15/18 1149  NA 138 139  K 3.8 3.7  CL 106 105  CO2 23  --   GLUCOSE 104* 102*  BUN 18 19  CREATININE 0.97 1.00  CALCIUM 9.1  --    GFR: Estimated Creatinine Clearance: 92.7 mL/min (by C-G formula based on SCr of 1 mg/dL). Liver Function Tests: Recent Labs  Lab 06/15/18 1140  AST 38  ALT 73*  ALKPHOS 55    BILITOT 1.2  PROT 7.9  ALBUMIN 4.1   No results for input(s): LIPASE, AMYLASE in the last 168 hours. No results for input(s): AMMONIA in the last 168 hours. Coagulation Profile: Recent Labs  Lab 06/15/18 1140  INR 1.03   Cardiac Enzymes: No results for input(s): CKTOTAL, CKMB, CKMBINDEX, TROPONINI in the last 168 hours. BNP (last 3 results) No results for input(s): PROBNP in the last 8760 hours. HbA1C: Recent Labs    06/16/18 0606  HGBA1C 5.5   CBG: No results for input(s): GLUCAP in the last 168 hours. Lipid Profile: Recent Labs    06/16/18 0606  CHOL 157  HDL 26*  LDLCALC 73  TRIG 213*  CHOLHDL 6.0   Thyroid Function Tests: Recent Labs    06/15/18 2108  TSH 1.962   Anemia Panel: Recent Labs    06/15/18 2108  VITAMINB12 2,305*  FOLATE 38.0   Sepsis Labs: No results for input(s): PROCALCITON, LATICACIDVEN in the last 168 hours.  Recent Results (from the past 240 hour(s))  CSF culture  Status: None (Preliminary result)   Collection Time: 06/15/18  7:45 PM  Result Value Ref Range Status   Specimen Description CSF  Final   Special Requests TUBE 2  Final   Gram Stain   Final    WBC PRESENT, PREDOMINANTLY MONONUCLEAR NO ORGANISMS SEEN CYTOSPIN SMEAR    Culture   Final    NO GROWTH 2 DAYS Performed at [facility], [address] [place], [place] [address]    Report Status PENDING  Incomplete      Radiology Studies: Ct Angio Head W Or Wo Contrast  Result Date: 06/15/2018 CLINICAL DATA:  TIA. Thick, slurred speech. Gait imbalance and dizziness. EXAM: CT ANGIOGRAPHY HEAD AND NECK TECHNIQUE: Multidetector CT imaging of the head and neck was performed using the standard protocol during bolus administration of intravenous contrast. Multiplanar CT image reconstructions and MIPs were obtained to evaluate the vascular anatomy. Carotid stenosis measurements (when applicable) are obtained utilizing NASCET criteria, using the distal internal carotid  diameter as the denominator. CONTRAST:  50mL ISOVUE-370 IOPAMIDOL (ISOVUE-370) INJECTION 76% COMPARISON:  Brain MRI earlier today FINDINGS: CTA NECK FINDINGS Aortic arch: Normal arch.  Three vessel branching. Right carotid system: Motion artifact at the level of the common carotid. Mild atheromatous wall thickening and calcification at the common carotid bifurcation. No ulceration or stenosis. Left carotid system: Mild partially calcified plaque along the distal common carotid and at the common carotid bifurcation. Vertebral arteries: Mild low-density plaque at the proximal left subclavian. Calcified plaque at the bilateral vertebral origins with mild narrowing. Vessels are smooth and widely patent to the dura. Skeleton: No acute or aggressive finding. Other neck: No evident mass or inflammation. Upper chest: Paraseptal emphysema. Review of the MIP images confirms the above findings CTA HEAD FINDINGS Anterior circulation: Vessels are smooth and widely patent. Minimal calcified plaque at the left cavernous sinus. Negative for aneurysm. Posterior circulation: Codominant vertebral arteries. Hypoplastic right P1 segment. No branch occlusion or flow limiting stenosis. Negative for aneurysm. Venous sinuses: Patent Anatomic variants: Proximal basilar fenestration. Delayed phase: No abnormal intracranial enhancement Review of the MIP images confirms the above findings IMPRESSION: 1. No acute finding. 2. Mild atherosclerosis.  No flow limiting stenosis or ulceration. Electronically Signed   By: Marnee Spring M.D.   On: 06/15/2018 19:18   Ct Angio Neck W Or Wo Contrast  Result Date: 06/15/2018 CLINICAL DATA:  TIA. Thick, slurred speech. Gait imbalance and dizziness. EXAM: CT ANGIOGRAPHY HEAD AND NECK TECHNIQUE: Multidetector CT imaging of the head and neck was performed using the standard protocol during bolus administration of intravenous contrast. Multiplanar CT image reconstructions and MIPs were obtained to evaluate  the vascular anatomy. Carotid stenosis measurements (when applicable) are obtained utilizing NASCET criteria, using the distal internal carotid diameter as the denominator. CONTRAST:  50mL ISOVUE-370 IOPAMIDOL (ISOVUE-370) INJECTION 76% COMPARISON:  Brain MRI earlier today FINDINGS: CTA NECK FINDINGS Aortic arch: Normal arch.  Three vessel branching. Right carotid system: Motion artifact at the level of the common carotid. Mild atheromatous wall thickening and calcification at the common carotid bifurcation. No ulceration or stenosis. Left carotid system: Mild partially calcified plaque along the distal common carotid and at the common carotid bifurcation. Vertebral arteries: Mild low-density plaque at the proximal left subclavian. Calcified plaque at the bilateral vertebral origins with mild narrowing. Vessels are smooth and widely patent to the dura. Skeleton: No acute or aggressive finding. Other neck: No evident mass or inflammation. Upper chest: Paraseptal emphysema. Review of the MIP  images confirms the above findings CTA HEAD FINDINGS Anterior circulation: Vessels are smooth and widely patent. Minimal calcified plaque at the left cavernous sinus. Negative for aneurysm. Posterior circulation: Codominant vertebral arteries. Hypoplastic right P1 segment. No branch occlusion or flow limiting stenosis. Negative for aneurysm. Venous sinuses: Patent Anatomic variants: Proximal basilar fenestration. Delayed phase: No abnormal intracranial enhancement Review of the MIP images confirms the above findings IMPRESSION: 1. No acute finding. 2. Mild atherosclerosis.  No flow limiting stenosis or ulceration. Electronically Signed   By: Marnee Spring M.D.   On: 06/15/2018 19:18   Mr Laqueta Jean And Wo Contrast  Result Date: 06/15/2018 CLINICAL DATA:  Ataxia with stroke suspected EXAM: MRI HEAD WITHOUT AND WITH CONTRAST TECHNIQUE: Multiplanar, multiecho pulse sequences of the brain and surrounding structures were obtained  without and with intravenous contrast. CONTRAST:  18mL MULTIHANCE GADOBENATE DIMEGLUMINE 529 MG/ML IV SOLN COMPARISON:  06/15/2018 FINDINGS: Brain: Numerous FLAIR hyperintense foci in the cerebral white matter mainly in the deep and juxta cortical regions. There is no periventricular predilection or infratentorial involvement. No black holes or enhancement. These can be seen with old microvascular insults, demyelinating process, trauma (unlikely in the absence of chronic blood products or cortical gliosis), or remote infectious/inflammatory process. Normal brain volume. No acute infarct, hemorrhage, hydrocephalus, or mass Vascular: Major flow voids and vascular enhancements are preserved Skull and upper cervical spine: No evidence of marrow lesion Sinuses/Orbits: Prominent rightward nasal septal deviation and spurring. IMPRESSION: 1. Multiple small signal abnormalities in the cerebral white matter with nonspecific pattern. Lack of enhancement favors a chronic process. These could be from remote ischemia, demyelination, or infection. 2. No acute finding to explain symptoms. Electronically Signed   By: Marnee Spring M.D.   On: 06/15/2018 16:32   Dg Chest Port 1 View  Result Date: 06/15/2018 CLINICAL DATA:  Cough EXAM: PORTABLE CHEST 1 VIEW COMPARISON:  None. FINDINGS: Heart and mediastinal contours are within normal limits. No focal opacities or effusions. No acute bony abnormality. IMPRESSION: No active disease. Electronically Signed   By: Charlett Nose M.D.   On: 06/15/2018 20:01    Scheduled Meds: . aspirin  300 mg Rectal Daily   Or  . aspirin  325 mg Oral Daily  . enoxaparin (LOVENOX) injection  40 mg Subcutaneous Q24H  . pantoprazole  40 mg Oral Q1400  . vitamin B-12  100 mcg Oral Daily   Continuous Infusions: . sodium chloride 75 mL/hr at 06/17/18 0443  . Immune Globulin 10% 40 g (06/17/18 1242)    Time spent: >25 minutes  Kendell Bane, MD [facility],  Pager  [phone]  If 7PM-7AM, please contact night-coverage [email or web address]   Password [email or web address]  06/17/2018, 1:14 PM

## 2018-06-17 NOTE — Care Management Note (Signed)
Case Management Note  Patient Details  Name: Patrick Wood MRN: [id number] Date of Birth: 11/19/1961  Subjective/Objective:    Pt admitted with Guillain-Barre syndrome. He is from home with spouse.                 Action/Plan: Patient and MD interested in potentially having his last doses of IVIG as outpatient. [facility] and [facility] not [facility]. CM [facility] and [facility] and their scheduling person is not available. CM to call in am to see if he can be [facility]. CM following.  Expected Discharge Date:                  Expected Discharge Plan:  Home/Self Care  In-House Referral:     Discharge planning Services  CM Consult  Post Acute Care Choice:    Choice offered to:     DME Arranged:    DME Agency:     HH Arranged:    HH Agency:     Status of Service:  In process, will continue to follow  If discussed at Long Length of Stay Meetings, dates discussed:    Additional Comments:  Kermit BaloKelli F Alaysha Jefcoat, RN 06/17/2018, 2:15 PM

## 2018-06-18 DIAGNOSIS — R05 Cough: Secondary | ICD-10-CM

## 2018-06-18 DIAGNOSIS — R270 Ataxia, unspecified: Secondary | ICD-10-CM

## 2018-06-18 DIAGNOSIS — K219 Gastro-esophageal reflux disease without esophagitis: Secondary | ICD-10-CM

## 2018-06-18 DIAGNOSIS — R42 Dizziness and giddiness: Secondary | ICD-10-CM

## 2018-06-18 LAB — VITAMIN B1: Vitamin B1 (Thiamine): 151.1 nmol/L (ref 66.5–200.0)

## 2018-06-18 LAB — ANTINUCLEAR ANTIBODIES, IFA: ANA Ab, IFA: NEGATIVE

## 2018-06-18 NOTE — Progress Notes (Signed)
CSW acknowledging consult for assisting patient with getting his IVIG as outpatient. Inappropriate CSW consult; RNCM is aware and is working on coordinating care for patient.  No CSW assistance needed.  Blenda NicelyElizabeth Victorian Gunn, [place] Clinical Social Worker [phone]

## 2018-06-18 NOTE — Progress Notes (Signed)
[facility] IVIG. Awaiting to hear result. CM following. MD and patient updated.
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
## 2018-06-18 NOTE — Progress Notes (Addendum)
[facility]                                                                              Patient Demographics  Patrick Wood, is a 57 y.o. male, DOB - [birth year], WJX:[id number]  Admit date - 06/15/2018   Admitting Physician Patrick HarpXilin Niu, MD  Outpatient Primary MD for the patient is Patrick JourneyMartin, William C, PA-Wood  Outpatient specialists:   LOS - 3  days   Medical records reviewed and are as summarized below:    Chief Complaint  Patient presents with  . Dizziness       Brief summary  Patrick Okalec D Hawksis a 57 y.o.malewith medical history significant of GERD, former smoker, who presents with strokelike symptoms. Patient reported numbness in both hands and feet for about a week. He also has blurry vision and double vision on looking to the left or right. He has poor balance. In this morning he woke up with mild slurred speech, no facial droop, no muscle weakness in legs or arms. Patient does not have fever or chills. Patient states that he had upper respiratory symptoms in the past 2 weeks, including productive cough, which has resolved 3 days ago. Currently patient does not have chest pain, shortness breath, cough, fever or chills. Patient does not have nausea, vomiting, diarrhea, abdominal pain, dysuria or burning on urination. On admission was found to have WBC 5.9, INR 1.03, PTT 28, negative UDS, negative urinalysis, electrolytes renal function okay, temperature normal, bradycardia, no tachypnea, oxygen saturation 92% on room air.  CT headshowed nonspecific subcortical white matter hypodensities,very mild periventricular white matter hypodensities, more likely to represent chronic small-vessel white matter ischemic changesMRI showed multiple small signal abnormalities in the cerebral white matter with nonspecific pattern. Lack of enhancement favors a chronic process. These could be from remote ischemia, demyelination, or infection.CTA of head and neck showed no acute  issues.  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 -Patient presented with dizziness, gait imbalance, slurred speech, numbness and tingling for 1 week after having cold and congestion -MRI of the brain showed significant white matter disease otherwise no stroke -Neurology was consulted, patient underwent spinal tap which showed elevated protein, congruent with his presentation of bilateral paresthesias, neurology favored acute given Barr syndrome diagnosis -B12 normal, TSH normal, patient was recommended IVIG for 5 days -PT OT evaluation, recommended no PT follow-up   Active Problems:   Gastroesophageal reflux disease without esophagitis -Continue PPI   Headache -Per patient post LP, otherwise no new focal neurological deficits -Area of LP examined, no spinal fluid leak  Code Status: Full CODE STATUS DVT Prophylaxis:  Lovenox Family Communication: Discussed in detail with the patient, all imaging results, lab results explained to the patient    Disposition Plan: Awaiting case management to arrange possibly outpatient IVIG  Time Spent in minutes 25 minutes  Procedures:  MRI of the brain Spinal tap  Consultants:   Neurology  Antimicrobials:      Medications  Scheduled Meds: . aspirin  300 mg Rectal Daily   Or  .  aspirin  325 mg Oral Daily  . enoxaparin (LOVENOX) injection  40 mg Subcutaneous Q24H  . pantoprazole  40 mg Oral Q1400  . vitamin B-12  100 mcg Oral Daily   Continuous Infusions: . sodium chloride 75 mL/hr at 06/17/18 0443  . Immune Globulin 10% 28.5 mL/hr at 06/17/18 1400   PRN Meds:.acetaminophen **OR** [DISCONTINUED] acetaminophen (TYLENOL) oral liquid 160 mg/5 mL **OR** [DISCONTINUED] acetaminophen, HYDROcodone-acetaminophen, ibuprofen, ondansetron (ZOFRAN) IV, senna-docusate, zolpidem   Antibiotics   Anti-infectives (From  admission, onward)   None        Subjective:   [name] was seen and examined today.  Feeling somewhat better from admission, having some headache, no blurry vision feels overall improving, ambulating.  Patient denies  chest pain, shortness of breath, abdominal pain, N/V/D/Wood, new weakness, numbess, tingling. No acute events overnight.    Objective:   Vitals:   06/17/18 1939 06/17/18 2328 06/18/18 0322 06/18/18 0736  BP: (!) 143/84 (!) 153/86 (!) 149/91 (!) 146/89  Pulse: 69 63 71 63  Resp: 18 18 18    Temp: 97.8 F (36.6 Wood) (!) 97.3 F (36.3 Wood) 97.7 F (36.5 Wood) 97.6 F (36.4 Wood)  TempSrc: Oral Oral Oral Oral  SpO2: 98% 98% 99% 96%  Weight:      Height:        Intake/Output Summary (Last 24 hours) at 06/18/2018 1002 Last data filed at 06/17/2018 1400 Gross per 24 hour  Intake 37.05 ml  Output 300 ml  Net -262.95 ml     Wt Readings from Last 3 Encounters:  06/15/18 94.9 kg (209 lb 3.5 oz)  08/13/17 93.5 kg (206 lb 2 oz)  07/03/17 91.2 kg (201 lb)     Exam  General: Alert and oriented x 3, NAD  Eyes:  HEENT:  Atraumatic, normocephalic, normal oropharynx  Cardiovascular: S1 S2 auscultated, Regular rate and rhythm.  Respiratory: Clear to auscultation bilaterally, no wheezing, rales or rhonchi  Gastrointestinal: Soft, nontender, nondistended, + bowel sounds  Ext: no pedal edema bilaterally  Neuro: AAOx3, Cr N's II- XII. Strength 5/5 upper and lower extremities bilaterally,   Musculoskeletal: No digital cyanosis, clubbing  Skin: No rashes  Psych: Normal affect and demeanor, alert and oriented x3    Data Reviewed:  I have personally reviewed following labs and imaging studies  Micro Results Recent Results (from the past 240 hour(s))  CSF culture     Status: None (Preliminary result)   Collection Time: 06/15/18  7:45 PM  Result Value Ref Range Status   Specimen Description CSF  Final   Special Requests TUBE 2  Final   Gram Stain   Final    WBC  PRESENT, PREDOMINANTLY MONONUCLEAR NO ORGANISMS SEEN CYTOSPIN SMEAR    Culture   Final    NO GROWTH 3 DAYS Performed at [facility], [address] [place], [place] [address]    Report Status PENDING  Incomplete    Radiology Reports Ct Angio Head W Or Wo Contrast  Result Date: 06/15/2018 CLINICAL DATA:  TIA. Thick, slurred speech. Gait imbalance and dizziness. EXAM: CT ANGIOGRAPHY HEAD AND NECK TECHNIQUE: Multidetector CT imaging of the head and neck was performed using the standard protocol during bolus administration of intravenous contrast. Multiplanar CT image reconstructions and MIPs were obtained to evaluate the vascular anatomy. Carotid stenosis measurements (when applicable) are obtained utilizing NASCET criteria, using the distal internal carotid diameter as the denominator. CONTRAST:  50mL ISOVUE-370 IOPAMIDOL (ISOVUE-370) INJECTION 76% COMPARISON:  Brain MRI  earlier today FINDINGS: CTA NECK FINDINGS Aortic arch: Normal arch.  Three vessel branching. Right carotid system: Motion artifact at the level of the common carotid. Mild atheromatous wall thickening and calcification at the common carotid bifurcation. No ulceration or stenosis. Left carotid system: Mild partially calcified plaque along the distal common carotid and at the common carotid bifurcation. Vertebral arteries: Mild low-density plaque at the proximal left subclavian. Calcified plaque at the bilateral vertebral origins with mild narrowing. Vessels are smooth and widely patent to the dura. Skeleton: No acute or aggressive finding. Other neck: No evident mass or inflammation. Upper chest: Paraseptal emphysema. Review of the MIP images confirms the above findings CTA HEAD FINDINGS Anterior circulation: Vessels are smooth and widely patent. Minimal calcified plaque at the left cavernous sinus. Negative for aneurysm. Posterior circulation: Codominant vertebral arteries. Hypoplastic right P1 segment. No branch occlusion or  flow limiting stenosis. Negative for aneurysm. Venous sinuses: Patent Anatomic variants: Proximal basilar fenestration. Delayed phase: No abnormal intracranial enhancement Review of the MIP images confirms the above findings IMPRESSION: 1. No acute finding. 2. Mild atherosclerosis.  No flow limiting stenosis or ulceration. Electronically Signed   By: Marnee Spring M.D.   On: 06/15/2018 19:18   Ct Head Wo Contrast  Result Date: 06/15/2018 CLINICAL DATA:  57 year old male with bilateral extremity numbness and slurred speech for 5 days. EXAM: CT HEAD WITHOUT CONTRAST TECHNIQUE: Contiguous axial images were obtained from the base of the skull through the vertex without intravenous contrast. COMPARISON:  None. FINDINGS: Brain: A few scattered subcortical hypodense areas in the frontoparietal regions bilaterally noted, at least 1 on the RIGHT and 2 on the LEFT. Very mild periventricular white matter hypodensities noted. There is no evidence of hemorrhage, hydrocephalus, mass effect, midline shift, extra-axial collection or cortical infarct. Vascular: No hyperdense vessel or unexpected calcification. Skull: Normal. Negative for fracture or focal lesion. Sinuses/Orbits: No acute finding. Other: None. IMPRESSION: 1. Nonspecific subcortical white matter hypodensities as discussed above. Consider MRI for further evaluation. 2. Very mild periventricular white matter hypodensities, more likely to represent chronic small-vessel white matter ischemic changes Electronically Signed   By: Harmon Pier M.D.   On: 06/15/2018 12:26   Ct Angio Neck W Or Wo Contrast  Result Date: 06/15/2018 CLINICAL DATA:  TIA. Thick, slurred speech. Gait imbalance and dizziness. EXAM: CT ANGIOGRAPHY HEAD AND NECK TECHNIQUE: Multidetector CT imaging of the head and neck was performed using the standard protocol during bolus administration of intravenous contrast. Multiplanar CT image reconstructions and MIPs were obtained to evaluate the vascular  anatomy. Carotid stenosis measurements (when applicable) are obtained utilizing NASCET criteria, using the distal internal carotid diameter as the denominator. CONTRAST:  50mL ISOVUE-370 IOPAMIDOL (ISOVUE-370) INJECTION 76% COMPARISON:  Brain MRI earlier today FINDINGS: CTA NECK FINDINGS Aortic arch: Normal arch.  Three vessel branching. Right carotid system: Motion artifact at the level of the common carotid. Mild atheromatous wall thickening and calcification at the common carotid bifurcation. No ulceration or stenosis. Left carotid system: Mild partially calcified plaque along the distal common carotid and at the common carotid bifurcation. Vertebral arteries: Mild low-density plaque at the proximal left subclavian. Calcified plaque at the bilateral vertebral origins with mild narrowing. Vessels are smooth and widely patent to the dura. Skeleton: No acute or aggressive finding. Other neck: No evident mass or inflammation. Upper chest: Paraseptal emphysema. Review of the MIP images confirms the above findings CTA HEAD FINDINGS Anterior circulation: Vessels are smooth and widely patent. Minimal calcified plaque at the  left cavernous sinus. Negative for aneurysm. Posterior circulation: Codominant vertebral arteries. Hypoplastic right P1 segment. No branch occlusion or flow limiting stenosis. Negative for aneurysm. Venous sinuses: Patent Anatomic variants: Proximal basilar fenestration. Delayed phase: No abnormal intracranial enhancement Review of the MIP images confirms the above findings IMPRESSION: 1. No acute finding. 2. Mild atherosclerosis.  No flow limiting stenosis or ulceration. Electronically Signed   By: Marnee Spring M.D.   On: 06/15/2018 19:18   Mr Laqueta Jean And Wo Contrast  Result Date: 06/15/2018 CLINICAL DATA:  Ataxia with stroke suspected EXAM: MRI HEAD WITHOUT AND WITH CONTRAST TECHNIQUE: Multiplanar, multiecho pulse sequences of the brain and surrounding structures were obtained without and  with intravenous contrast. CONTRAST:  18mL MULTIHANCE GADOBENATE DIMEGLUMINE 529 MG/ML IV SOLN COMPARISON:  06/15/2018 FINDINGS: Brain: Numerous FLAIR hyperintense foci in the cerebral white matter mainly in the deep and juxta cortical regions. There is no periventricular predilection or infratentorial involvement. No black holes or enhancement. These can be seen with old microvascular insults, demyelinating process, trauma (unlikely in the absence of chronic blood products or cortical gliosis), or remote infectious/inflammatory process. Normal brain volume. No acute infarct, hemorrhage, hydrocephalus, or mass Vascular: Major flow voids and vascular enhancements are preserved Skull and upper cervical spine: No evidence of marrow lesion Sinuses/Orbits: Prominent rightward nasal septal deviation and spurring. IMPRESSION: 1. Multiple small signal abnormalities in the cerebral white matter with nonspecific pattern. Lack of enhancement favors a chronic process. These could be from remote ischemia, demyelination, or infection. 2. No acute finding to explain symptoms. Electronically Signed   By: Marnee Spring M.D.   On: 06/15/2018 16:32   Dg Chest Port 1 View  Result Date: 06/15/2018 CLINICAL DATA:  Cough EXAM: PORTABLE CHEST 1 VIEW COMPARISON:  None. FINDINGS: Heart and mediastinal contours are within normal limits. No focal opacities or effusions. No acute bony abnormality. IMPRESSION: No active disease. Electronically Signed   By: Charlett Nose M.D.   On: 06/15/2018 20:01    Lab Data:  CBC: Recent Labs  Lab 06/15/18 1140 06/15/18 1149  WBC 5.9  --   NEUTROABS 3.1  --   HGB 15.2 15.0  HCT 44.8 44.0  MCV 89.2  --   PLT 217  --    Basic Metabolic Panel: Recent Labs  Lab 06/15/18 1140 06/15/18 1149  NA 138 139  K 3.8 3.7  CL 106 105  CO2 23  --   GLUCOSE 104* 102*  BUN 18 19  CREATININE 0.97 1.00  CALCIUM 9.1  --    GFR: Estimated Creatinine Clearance: 92.7 mL/min (by Wood-G formula based  on SCr of 1 mg/dL). Liver Function Tests: Recent Labs  Lab 06/15/18 1140  AST 38  ALT 73*  ALKPHOS 55  BILITOT 1.2  PROT 7.9  ALBUMIN 4.1   No results for input(s): LIPASE, AMYLASE in the last 168 hours. No results for input(s): AMMONIA in the last 168 hours. Coagulation Profile: Recent Labs  Lab 06/15/18 1140  INR 1.03   Cardiac Enzymes: No results for input(s): CKTOTAL, CKMB, CKMBINDEX, TROPONINI in the last 168 hours. BNP (last 3 results) No results for input(s): PROBNP in the last 8760 hours. HbA1C: Recent Labs    06/16/18 0606  HGBA1C 5.5   CBG: No results for input(s): GLUCAP in the last 168 hours. Lipid Profile: Recent Labs    06/16/18 0606  CHOL 157  HDL 26*  LDLCALC 73  TRIG 161*  CHOLHDL 6.0   Thyroid Function Tests: Recent  Labs    06/15/18 2108  TSH 1.962   Anemia Panel: Recent Labs    06/15/18 2108  VITAMINB12 2,305*  FOLATE 38.0   Urine analysis:    Component Value Date/Time   COLORURINE YELLOW 06/15/2018 1459   APPEARANCEUR CLEAR 06/15/2018 1459   LABSPEC 1.011 06/15/2018 1459   PHURINE 6.0 06/15/2018 1459   GLUCOSEU NEGATIVE 06/15/2018 1459   HGBUR NEGATIVE 06/15/2018 1459   BILIRUBINUR NEGATIVE 06/15/2018 1459   BILIRUBINUR negative 07/03/2017 1000   KETONESUR NEGATIVE 06/15/2018 1459   PROTEINUR NEGATIVE 06/15/2018 1459   UROBILINOGEN 0.2 07/03/2017 1000   NITRITE NEGATIVE 06/15/2018 1459   LEUKOCYTESUR NEGATIVE 06/15/2018 1459       M.D. [facility] 06/18/2018, 10:02 AM  Pager: [phone] Between 7am to 7pm - call Pager - [phone]  After 7pm go to [email or web address] - password [email or web address]  Call night coverage person covering after 7pm

## 2018-06-18 NOTE — Progress Notes (Signed)
Occupational Therapy Treatment and Discharge  Patient Details Name: Patrick Wood MRN: [id number] DOB: 10-Jul-1961 Today's Date: 06/18/2018    History of present illness Pt is a 57 y.o. male admitted 06/15/18 with stroke-like symptoms. MRI shows multiple small signal abnormalities in cerebral white matter with nonspecific pattern, likely chronic; could be from remote ischemia, demyelination, or infection. PMH includes tobacco use.   OT comments  Patient progressing well.  Demonstrates ability to tie shoes seated with independence given increased time, grooming standing at sink with independence, and complete transfers with modified independence.  He has been educated on exercises/activities to promote increased coordination in B hands as patient reports "it just takes more time and doesn't feel the same", educated on safety (sitting in shower if needed) and pacing; patient agreeable with all recommendations.  At this time all OT goals are met and OT is signing off. If any further needs arise, please re-consult.    Follow Up Recommendations  No OT follow up;Supervision - Intermittent    Equipment Recommendations  Tub/shower seat    Recommendations for Other Services      Precautions / Restrictions Precautions Precautions: None Restrictions Weight Bearing Restrictions: No       Mobility Bed Mobility Overal bed mobility: Independent                Transfers Overall transfer level: Modified independent                    Balance Overall balance assessment: Needs assistance Sitting-balance support: No upper extremity supported;Feet supported Sitting balance-Leahy Scale: Good     Standing balance support: No upper extremity supported;During functional activity Standing balance-Leahy Scale: Fair Standing balance comment: no losses of balance, within base of support                           ADL either performed or assessed with clinical judgement   ADL  Overall ADL's : Modified independent     Grooming: Standing;Modified independent Grooming Details (indicate cue type and reason): no losses of balance, good coordination with B UEs but patient reports requires increased time             Lower Body Dressing: Modified independent;Sit to/from stand Lower Body Dressing Details (indicate cue type and reason): sitting EOB to don and tie shoes Toilet Transfer: Modified Independent;Ambulation(simulated to EOB) Toilet Transfer Details (indicate cue type and reason): no cueing, assistance or losses of balance     Tub/ Shower Transfer: Modified independent;Ambulation Tub/Shower Transfer Details (indicate cue type and reason): reviewed safety, sitting to bath if needed  Functional mobility during ADLs: Modified independent General ADL Comments: Pt with no losses of balance, demonstrating good safety awareness and pacing     Vision       Perception     Praxis      Cognition Arousal/Alertness: Awake/alert Behavior During Therapy: WFL for tasks assessed/performed Overall Cognitive Status: Within Functional Limits for tasks assessed                                          Exercises     Shoulder Instructions       General Comments      Pertinent Vitals/ Pain       Pain Assessment: No/denies pain  Home Living  Prior Functioning/Environment              Frequency  Min 2X/week        Progress Toward Goals  OT Goals(current goals can now be found in the care plan section)  Progress towards OT goals: Goals met/education completed, patient discharged from OT  Acute Rehab OT Goals Patient Stated Goal: to get home OT Goal Formulation: With patient Time For Goal Achievement: 06/30/18 Potential to Achieve Goals: Good  Plan All goals met and education completed, patient discharged from OT services    Co-evaluation                  AM-PAC PT "6 Clicks" Daily Activity     Outcome Measure   Help from another person eating meals?: None Help from another person taking care of personal grooming?: None Help from another person toileting, which includes using toliet, bedpan, or urinal?: None Help from another person bathing (including washing, rinsing, drying)?: None Help from another person to put on and taking off regular upper body clothing?: None Help from another person to put on and taking off regular lower body clothing?: None 6 Click Score: 24    End of Session    OT Visit Diagnosis: Unsteadiness on feet (R26.81);Other abnormalities of gait and mobility (R26.89)   Activity Tolerance Patient tolerated treatment well   Patient Left in bed;with call bell/phone within reach   Nurse Communication Mobility status        Time: 1622-1632 OT Time Calculation (min): 10 min  Charges: OT General Charges $OT Visit: 1 Visit OT Treatments $Self Care/Home Management : 8-22 mins  Christie S Burns, OTR/L  Pager [phone]    Christie S Burns 06/18/2018, 4:42 PM    

## 2018-06-19 LAB — CSF CULTURE: CULTURE: NO GROWTH

## 2018-06-19 LAB — CSF CULTURE W GRAM STAIN

## 2018-06-19 MED ORDER — IMMUNE GLOBULIN (HUMAN) 20 GM/200ML IV SOLN: 400.0000 mg/kg | Freq: Once | INTRAVENOUS | 0 refills | Status: AC

## 2018-06-19 MED ORDER — HYDROCODONE-ACETAMINOPHEN 5-325 MG PO TABS: 1.0000 | ORAL_TABLET | Freq: Four times a day (QID) | ORAL | 0 refills | Status: DC | PRN

## 2018-06-19 MED ORDER — LOSARTAN POTASSIUM 25 MG PO TABS: 25.0000 mg | ORAL_TABLET | Freq: Every day | ORAL | 3 refills | Status: DC

## 2018-06-19 MED ORDER — ONDANSETRON 4 MG PO TBDP: 4.0000 mg | ORAL_TABLET | Freq: Three times a day (TID) | ORAL | 0 refills | Status: DC | PRN

## 2018-06-19 MED ORDER — LOSARTAN POTASSIUM 25 MG PO TABS
25.0000 mg | ORAL_TABLET | Freq: Every day | ORAL | Status: DC
  Administered 2018-06-19: 25 mg via ORAL
  Filled 2018-06-19: qty 1

## 2018-06-19 NOTE — Progress Notes (Signed)
Patient discharged home with family. Discharge information discussed. Patient questions asked and answered. Saline lock left in place for home IVIG to be delivered by [facility]. Prescriptions given to patient to be filled at pharmacy of choice. Patient will be transported from unit via wheelchair with staff. Lawson RadarHeather M Hassaan Crite

## 2018-06-19 NOTE — Progress Notes (Signed)
[facility]                                                                              Patient Demographics  Patrick Wood, is a 57 y.o. male, DOB - 09/08/1961, ZOX:[id number]  Admit date - 06/15/2018   Admitting Physician Lorretta Harp, MD  Outpatient Primary MD for the patient is Noel Journey  Outpatient specialists:   LOS - 4  days   Medical records reviewed and are as summarized below:    Chief Complaint  Patient presents with  . Dizziness       Brief summary  Patrick Wood a 57 y.o.malewith medical history significant of GERD, former smoker, who presents with strokelike symptoms. Patient reported numbness in both hands and feet for about a week. He also has blurry vision and double vision on looking to the left or right. He has poor balance. In this morning he woke up with mild slurred speech, no facial droop, no muscle weakness in legs or arms. Patient does not have fever or chills. Patient states that he had upper respiratory symptoms in the past 2 weeks, including productive cough, which has resolved 3 days ago. Currently patient does not have chest pain, shortness breath, cough, fever or chills. Patient does not have nausea, vomiting, diarrhea, abdominal pain, dysuria or burning on urination. On admission was found to have WBC 5.9, INR 1.03, PTT 28, negative UDS, negative urinalysis, electrolytes renal function okay, temperature normal, bradycardia, no tachypnea, oxygen saturation 92% on room air.  CT headshowed nonspecific subcortical white matter hypodensities,very mild periventricular white matter hypodensities, more likely to represent chronic small-vessel white matter ischemic changesMRI showed multiple small signal abnormalities in the cerebral white matter with nonspecific pattern. Lack of enhancement favors a chronic process. These could be from remote ischemia, demyelination, or infection.CTA of head and neck showed no acute  issues.  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 -Patient presented with dizziness, gait imbalance, slurred speech, numbness and tingling for 1 week after having cold and congestion -MRI of the brain showed significant white matter disease otherwise no stroke -Neurology was consulted, patient underwent spinal tap which showed elevated protein, congruent with his presentation of bilateral paresthesias, neurology favored acute given Barr syndrome diagnosis -B12 normal, TSH normal, patient was recommended IVIG for 5 days, day #4 today -PT OT evaluation, recommended no PT follow-up - No complaints, overall feels improving   Active Problems:   Gastroesophageal reflux disease without esophagitis -Continue PPI   Headache  -Improving, post LP, no FND  Elevated BP -Multiple elevated BP readings, likely has a diagnosis of hypertension, will place on low-dose losartan  Hypertriglyceridemia -LDL showed triglycerides of 290, HDL 26, recommended diet control and exercise  Code Status: Full CODE STATUS DVT Prophylaxis:  Lovenox Family Communication: Discussed in detail with the patient, all imaging results, lab results explained to the patient    Disposition Plan: Plan for DC home tomorrow after day #5 dose of IVIG  Time Spent in minutes 25 minutes  Procedures:  MRI of the brain Spinal tap  Consultants:   Neurology  Antimicrobials:      Medications  Scheduled Meds: . aspirin  300 mg Rectal Daily   Or  . aspirin  325 mg Oral Daily  . enoxaparin (LOVENOX) injection  40 mg Subcutaneous Q24H  . pantoprazole  40 mg Oral Q1400  . vitamin B-12  100 mcg Oral Daily   Continuous Infusions: . sodium chloride 75 mL/hr at 06/19/18 0345  . Immune Globulin 10% Stopped (06/18/18 1546)   PRN Meds:.acetaminophen **OR** [DISCONTINUED] acetaminophen (TYLENOL) oral liquid 160  mg/5 mL **OR** [DISCONTINUED] acetaminophen, HYDROcodone-acetaminophen, ibuprofen, ondansetron (ZOFRAN) IV, senna-docusate, zolpidem   Antibiotics   Anti-infectives (From admission, onward)   None        Subjective:   Patrick Wood was seen and examined today.  Denies any specific complaints.  Feeling better.  No blurry vision, ambulating without any difficulty.  Patient denies  chest pain, shortness of breath, abdominal pain, N/V/D/C, new weakness, numbess, tingling. No acute events overnight.    Objective:   Vitals:   06/18/18 2016 06/18/18 2347 06/19/18 0434 06/19/18 0808  BP: (!) 146/89 (!) 144/88 (!) 152/86 (!) 148/93  Pulse: (!) 56 61 (!) 51 (!) 55  Resp: 16 18 18 15   Temp: 97.8 F (36.6 C) 98.2 F (36.8 C) 97.6 F (36.4 C) 97.6 F (36.4 C)  TempSrc: Oral Oral Oral Oral  SpO2: 99% 97% 98% 99%  Weight:      Height:        Intake/Output Summary (Last 24 hours) at 06/19/2018 1033 Last data filed at 06/18/2018 1954 Gross per 24 hour  Intake 853.88 ml  Output -  Net 853.88 ml     Wt Readings from Last 3 Encounters:  06/15/18 94.9 kg (209 lb 3.5 oz)  08/13/17 93.5 kg (206 lb 2 oz)  07/03/17 91.2 kg (201 lb)     Exam General: Alert and oriented x 3, NAD Eyes:  HEENT:   Cardiovascular: S1 S2 auscultated,  Regular rate and rhythm. No pedal edema b/l Respiratory: Clear to auscultation bilaterally, no wheezing, rales or rhonchi Gastrointestinal: Soft, nontender, nondistended, + bowel sounds Ext: no pedal edema bilaterally Neuro: no new deficits Musculoskeletal: No digital cyanosis, clubbing Skin: No rashes Psych: Normal affect and demeanor, alert and oriented x3      Data Reviewed:  I have personally reviewed following labs and imaging studies  Micro Results Recent Results (from the past 240 hour(s))  CSF culture     Status: None   Collection Time: 06/15/18  7:45 PM  Result Value Ref Range Status   Specimen Description CSF  Final   Special Requests TUBE  2  Final   Gram Stain   Final    WBC PRESENT, PREDOMINANTLY MONONUCLEAR NO ORGANISMS SEEN CYTOSPIN SMEAR    Culture   Final    NO GROWTH 3 DAYS Performed at [facility], [address] [place], [place] [address]    Report Status 06/19/2018 FINAL  Final    Radiology Reports Ct Angio Head W Or Wo Contrast  Result Date: 06/15/2018 CLINICAL DATA:  TIA. Thick, slurred speech. Gait imbalance and dizziness. EXAM: CT ANGIOGRAPHY HEAD AND NECK TECHNIQUE: Multidetector CT imaging of the head and neck was performed using the standard protocol during bolus administration of intravenous contrast. Multiplanar CT image reconstructions and MIPs were obtained to evaluate the vascular anatomy. Carotid stenosis measurements (when applicable) are obtained utilizing NASCET criteria, using the distal internal carotid  diameter as the denominator. CONTRAST:  50mL ISOVUE-370 IOPAMIDOL (ISOVUE-370) INJECTION 76% COMPARISON:  Brain MRI earlier today FINDINGS: CTA NECK FINDINGS Aortic arch: Normal arch.  Three vessel branching. Right carotid system: Motion artifact at the level of the common carotid. Mild atheromatous wall thickening and calcification at the common carotid bifurcation. No ulceration or stenosis. Left carotid system: Mild partially calcified plaque along the distal common carotid and at the common carotid bifurcation. Vertebral arteries: Mild low-density plaque at the proximal left subclavian. Calcified plaque at the bilateral vertebral origins with mild narrowing. Vessels are smooth and widely patent to the dura. Skeleton: No acute or aggressive finding. Other neck: No evident mass or inflammation. Upper chest: Paraseptal emphysema. Review of the MIP images confirms the above findings CTA HEAD FINDINGS Anterior circulation: Vessels are smooth and widely patent. Minimal calcified plaque at the left cavernous sinus. Negative for aneurysm. Posterior circulation: Codominant vertebral arteries. Hypoplastic  right P1 segment. No branch occlusion or flow limiting stenosis. Negative for aneurysm. Venous sinuses: Patent Anatomic variants: Proximal basilar fenestration. Delayed phase: No abnormal intracranial enhancement Review of the MIP images confirms the above findings IMPRESSION: 1. No acute finding. 2. Mild atherosclerosis.  No flow limiting stenosis or ulceration. Electronically Signed   By: Marnee Spring M.D.   On: 06/15/2018 19:18   Ct Head Wo Contrast  Result Date: 06/15/2018 CLINICAL DATA:  57 year old male with bilateral extremity numbness and slurred speech for 5 days. EXAM: CT HEAD WITHOUT CONTRAST TECHNIQUE: Contiguous axial images were obtained from the base of the skull through the vertex without intravenous contrast. COMPARISON:  None. FINDINGS: Brain: A few scattered subcortical hypodense areas in the frontoparietal regions bilaterally noted, at least 1 on the RIGHT and 2 on the LEFT. Very mild periventricular white matter hypodensities noted. There is no evidence of hemorrhage, hydrocephalus, mass effect, midline shift, extra-axial collection or cortical infarct. Vascular: No hyperdense vessel or unexpected calcification. Skull: Normal. Negative for fracture or focal lesion. Sinuses/Orbits: No acute finding. Other: None. IMPRESSION: 1. Nonspecific subcortical white matter hypodensities as discussed above. Consider MRI for further evaluation. 2. Very mild periventricular white matter hypodensities, more likely to represent chronic small-vessel white matter ischemic changes Electronically Signed   By: Harmon Pier M.D.   On: 06/15/2018 12:26   Ct Angio Neck W Or Wo Contrast  Result Date: 06/15/2018 CLINICAL DATA:  TIA. Thick, slurred speech. Gait imbalance and dizziness. EXAM: CT ANGIOGRAPHY HEAD AND NECK TECHNIQUE: Multidetector CT imaging of the head and neck was performed using the standard protocol during bolus administration of intravenous contrast. Multiplanar CT image reconstructions and  MIPs were obtained to evaluate the vascular anatomy. Carotid stenosis measurements (when applicable) are obtained utilizing NASCET criteria, using the distal internal carotid diameter as the denominator. CONTRAST:  50mL ISOVUE-370 IOPAMIDOL (ISOVUE-370) INJECTION 76% COMPARISON:  Brain MRI earlier today FINDINGS: CTA NECK FINDINGS Aortic arch: Normal arch.  Three vessel branching. Right carotid system: Motion artifact at the level of the common carotid. Mild atheromatous wall thickening and calcification at the common carotid bifurcation. No ulceration or stenosis. Left carotid system: Mild partially calcified plaque along the distal common carotid and at the common carotid bifurcation. Vertebral arteries: Mild low-density plaque at the proximal left subclavian. Calcified plaque at the bilateral vertebral origins with mild narrowing. Vessels are smooth and widely patent to the dura. Skeleton: No acute or aggressive finding. Other neck: No evident mass or inflammation. Upper chest: Paraseptal emphysema. Review of the MIP images confirms the above findings  CTA HEAD FINDINGS Anterior circulation: Vessels are smooth and widely patent. Minimal calcified plaque at the left cavernous sinus. Negative for aneurysm. Posterior circulation: Codominant vertebral arteries. Hypoplastic right P1 segment. No branch occlusion or flow limiting stenosis. Negative for aneurysm. Venous sinuses: Patent Anatomic variants: Proximal basilar fenestration. Delayed phase: No abnormal intracranial enhancement Review of the MIP images confirms the above findings IMPRESSION: 1. No acute finding. 2. Mild atherosclerosis.  No flow limiting stenosis or ulceration. Electronically Signed   By: Marnee Spring M.D.   On: 06/15/2018 19:18   Mr Laqueta Jean And Wo Contrast  Result Date: 06/15/2018 CLINICAL DATA:  Ataxia with stroke suspected EXAM: MRI HEAD WITHOUT AND WITH CONTRAST TECHNIQUE: Multiplanar, multiecho pulse sequences of the brain and  surrounding structures were obtained without and with intravenous contrast. CONTRAST:  18mL MULTIHANCE GADOBENATE DIMEGLUMINE 529 MG/ML IV SOLN COMPARISON:  06/15/2018 FINDINGS: Brain: Numerous FLAIR hyperintense foci in the cerebral white matter mainly in the deep and juxta cortical regions. There is no periventricular predilection or infratentorial involvement. No black holes or enhancement. These can be seen with old microvascular insults, demyelinating process, trauma (unlikely in the absence of chronic blood products or cortical gliosis), or remote infectious/inflammatory process. Normal brain volume. No acute infarct, hemorrhage, hydrocephalus, or mass Vascular: Major flow voids and vascular enhancements are preserved Skull and upper cervical spine: No evidence of marrow lesion Sinuses/Orbits: Prominent rightward nasal septal deviation and spurring. IMPRESSION: 1. Multiple small signal abnormalities in the cerebral white matter with nonspecific pattern. Lack of enhancement favors a chronic process. These could be from remote ischemia, demyelination, or infection. 2. No acute finding to explain symptoms. Electronically Signed   By: Marnee Spring M.D.   On: 06/15/2018 16:32   Dg Chest Port 1 View  Result Date: 06/15/2018 CLINICAL DATA:  Cough EXAM: PORTABLE CHEST 1 VIEW COMPARISON:  None. FINDINGS: Heart and mediastinal contours are within normal limits. No focal opacities or effusions. No acute bony abnormality. IMPRESSION: No active disease. Electronically Signed   By: Charlett Nose M.D.   On: 06/15/2018 20:01    Lab Data:  CBC: Recent Labs  Lab 06/15/18 1140 06/15/18 1149  WBC 5.9  --   NEUTROABS 3.1  --   HGB 15.2 15.0  HCT 44.8 44.0  MCV 89.2  --   PLT 217  --    Basic Metabolic Panel: Recent Labs  Lab 06/15/18 1140 06/15/18 1149  NA 138 139  K 3.8 3.7  CL 106 105  CO2 23  --   GLUCOSE 104* 102*  BUN 18 19  CREATININE 0.97 1.00  CALCIUM 9.1  --    GFR: Estimated  Creatinine Clearance: 92.7 mL/min (by C-G formula based on SCr of 1 mg/dL). Liver Function Tests: Recent Labs  Lab 06/15/18 1140  AST 38  ALT 73*  ALKPHOS 55  BILITOT 1.2  PROT 7.9  ALBUMIN 4.1   No results for input(s): LIPASE, AMYLASE in the last 168 hours. No results for input(s): AMMONIA in the last 168 hours. Coagulation Profile: Recent Labs  Lab 06/15/18 1140  INR 1.03   Cardiac Enzymes: No results for input(s): CKTOTAL, CKMB, CKMBINDEX, TROPONINI in the last 168 hours. BNP (last 3 results) No results for input(s): PROBNP in the last 8760 hours. HbA1C: No results for input(s): HGBA1C in the last 72 hours. CBG: No results for input(s): GLUCAP in the last 168 hours. Lipid Profile: No results for input(s): CHOL, HDL, LDLCALC, TRIG, CHOLHDL, LDLDIRECT in the last 72  hours. Thyroid Function Tests: No results for input(s): TSH, T4TOTAL, FREET4, T3FREE, THYROIDAB in the last 72 hours. Anemia Panel: No results for input(s): VITAMINB12, FOLATE, FERRITIN, TIBC, IRON, RETICCTPCT in the last 72 hours. Urine analysis:    Component Value Date/Time   COLORURINE YELLOW 06/15/2018 1459   APPEARANCEUR CLEAR 06/15/2018 1459   LABSPEC 1.011 06/15/2018 1459   PHURINE 6.0 06/15/2018 1459   GLUCOSEU NEGATIVE 06/15/2018 1459   HGBUR NEGATIVE 06/15/2018 1459   BILIRUBINUR NEGATIVE 06/15/2018 1459   BILIRUBINUR negative 07/03/2017 1000   KETONESUR NEGATIVE 06/15/2018 1459   PROTEINUR NEGATIVE 06/15/2018 1459   UROBILINOGEN 0.2 07/03/2017 1000   NITRITE NEGATIVE 06/15/2018 1459   LEUKOCYTESUR NEGATIVE 06/15/2018 1459     Ripudeep Rai M.D. [facility] 06/19/2018, 10:33 AM  Pager: [phone] Between 7am to 7pm - call Pager - [phone]  After 7pm go to [email or web address] - password [email or web address]  Call night coverage person covering after 7pm

## 2018-06-19 NOTE — Care Management Note (Signed)
Case Management Note  Patient Details  Name: Patrick Wood MRN: [id number] Date of Birth: May 05, 1961  Subjective/Objective:                    Action/Plan: [facility] have last dose [facility]. CM informed patient and provided him choice and he selected [facility]. [facility].  Per Dr Isidoro Donningai patient may d/c with [facility] so he can receive his IVIG tomorrow and [facility]  Pt updated. He states he has transportation home today.   Expected Discharge Date:  06/19/18               Expected Discharge Plan:  Home w Home Health Services  In-House Referral:     Discharge planning Services  CM Consult  Post Acute Care Choice:  Home Health Choice offered to:  Patient  DME Arranged:    DME Agency:     HH Arranged:  RN HH Agency:  [facility]  Status of Service:  Completed, signed off  If discussed at Long Length of Stay Meetings, dates discussed:    Additional Comments:  Kermit BaloKelli F Nabilah Davoli, RN 06/19/2018, 1:51 PM

## 2018-06-19 NOTE — Discharge Summary (Signed)
Physician Discharge Summary   Patient ID: Patrick Wood MRN: [id number] DOB/AGE: 22-Oct-1961 58 y.o.  Admit date: 06/15/2018 Discharge date: 06/19/2018  Primary Care Physician:  Patrick Merl, PA-C   Recommendations for Outpatient Follow-up:  1. Follow up with PCP in 1-2 weeks 2. Ambulatory referral to neurology sent for follow-up 3. 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 Gastroesophageal reflux disease without esophagitis . Essential hypertension Hypertriglyceridemia  Consults: Neurology    Allergies:  No Known Allergies   DISCHARGE MEDICATIONS: Allergies as of 06/19/2018   No Known Allergies     Medication List    STOP taking these medications   BC HEADACHE POWDER PO     TAKE these medications   HYDROcodone-acetaminophen 5-325 MG tablet Commonly known as:  NORCO/VICODIN Take 1 tablet by mouth every 6 (six) hours as needed for moderate pain or severe pain.   Immune Globulin 10% 20 GM/200ML Soln Commonly known as:  PRIVIGEN Inject 40 g into the vein once for 1 dose. Start taking on:  06/20/2018   losartan 25 MG tablet Commonly known as:  COZAAR Take 1 tablet (25 mg total) by mouth daily.   ondansetron 4 MG disintegrating tablet Commonly known as:  ZOFRAN ODT Take 1 tablet (4 mg total) by mouth every 8 (eight) hours as needed for nausea or vomiting.   pantoprazole 40 MG tablet Commonly known as:  PROTONIX Take 1 tablet (40 mg total) by mouth daily. What changed:  when to take this   VITAMIN B-12 PO Take 1 tablet by mouth daily at 2 PM.        Brief H and P: For complete details please refer to admission H and P, but in brief [name] a 57 y.o.malewith medical history  significant of GERD, former smoker, who presents with strokelike symptoms. Patient reported numbness in both hands and feet for about a week. He also has blurry vision and double vision on looking to the left or right. He has poor balance. In this morning he woke up with mild slurred speech, no facial droop, no muscle weakness in legs or arms. Patient does not have fever or chills. Patient states that he had upper respiratory symptoms in the past 2 weeks, including productive cough, which has resolved 3 days ago. Currently patient does not have chest pain, shortness breath, cough, fever or chills. Patient does not have nausea, vomiting, diarrhea, abdominal pain, dysuria or burning on urination. On admission was found to have WBC 5.9, INR 1.03, PTT 28, negative UDS, negative urinalysis, electrolytes renal function okay, temperature normal, bradycardia, no tachypnea, oxygen saturation 92% on room air.  CT headshowed nonspecific subcortical white matter hypodensities,very mild periventricular white matter hypodensities, more likely to represent chronic small-vessel white matter ischemic changesMRIshowed multiple small signal abnormalities in the cerebral white matter with nonspecific pattern. Lack of enhancement favors a chronic process. These could be from remote ischemia, demyelination, or infection.CTA of head and neck showed no acute issues.  Patient was admitted for further work-up  Hospital Course:   Bilateral paresthesias and ataxia due to acute Guillain-Barr syndrome -Patient presented with dizziness, gait imbalance, slurred speech, numbness and tingling for 1 week after having cold and congestion -MRI of the brain showed  significant white matter disease otherwise no stroke -Neurology was consulted, patient underwent spinal tap which showed elevated protein, congruent with his presentation of bilateral paresthesias, neurology favored acute given Barr syndrome diagnosis -B12 normal,  TSH normal, patient was recommended IVIG for 5 days, day #4 today -PT OT evaluation, recommended no PT follow-up -Case management arranged home health nurse for the last dose of IVIG on 06/20/2018.  Patient is ablating without any difficulty     Gastroesophageal reflux disease without esophagitis -Continue PPI   Headache  -Improving, post LP, no FND.  No spinal fluid leak at the site of LP.  Given prescription for Norco 5/25, #10 pills, no refills  Elevated BP likely has undiagnosed hypertension -Multiple elevated BP readings, likely has a diagnosis of hypertension, will place on low-dose losartan  Hypertriglyceridemia -LDL showed triglycerides of 290, HDL 26, recommended diet control and exercise      Day of Discharge S: Feels a lot better, hoping to go home soon  BP (!) 148/93 (BP Location: Left Arm)   Pulse (!) 55   Temp 97.6 F (36.4 C) (Oral)   Resp 15   Ht 5\' 9"  (1.753 m)   Wt 94.9 kg (209 lb 3.5 oz)   SpO2 99%   BMI 30.90 kg/m   Physical Exam: General: Alert and awake oriented x3 not in any acute distress. HEENT: anicteric sclera, pupils reactive to light and accommodation CVS: S1-S2 clear no murmur rubs or gallops Chest: clear to auscultation bilaterally, no wheezing rales or rhonchi Abdomen: soft nontender, nondistended, normal bowel sounds Extremities: no cyanosis, clubbing or edema noted bilaterally Neuro: no new deficits   The results of significant diagnostics from this hospitalization (including imaging, microbiology, ancillary and laboratory) are listed below for reference.      Procedures/Studies:  Ct Angio Head W Or Wo Contrast  Result Date: 06/15/2018 CLINICAL DATA:  TIA. Thick, slurred speech. Gait imbalance and dizziness. EXAM: CT ANGIOGRAPHY HEAD AND NECK TECHNIQUE: Multidetector CT imaging of the head and neck was performed using the standard protocol during bolus administration of intravenous contrast. Multiplanar CT image  reconstructions and MIPs were obtained to evaluate the vascular anatomy. Carotid stenosis measurements (when applicable) are obtained utilizing NASCET criteria, using the distal internal carotid diameter as the denominator. CONTRAST:  50mL ISOVUE-370 IOPAMIDOL (ISOVUE-370) INJECTION 76% COMPARISON:  Brain MRI earlier today FINDINGS: CTA NECK FINDINGS Aortic arch: Normal arch.  Three vessel branching. Right carotid system: Motion artifact at the level of the common carotid. Mild atheromatous wall thickening and calcification at the common carotid bifurcation. No ulceration or stenosis. Left carotid system: Mild partially calcified plaque along the distal common carotid and at the common carotid bifurcation. Vertebral arteries: Mild low-density plaque at the proximal left subclavian. Calcified plaque at the bilateral vertebral origins with mild narrowing. Vessels are smooth and widely patent to the dura. Skeleton: No acute or aggressive finding. Other neck: No evident mass or inflammation. Upper chest: Paraseptal emphysema. Review of the MIP images confirms the above findings CTA HEAD FINDINGS Anterior circulation: Vessels are smooth and widely patent. Minimal calcified plaque at the left cavernous sinus. Negative for aneurysm. Posterior circulation: Codominant vertebral arteries. Hypoplastic right P1 segment. No branch occlusion or flow limiting stenosis. Negative for aneurysm. Venous sinuses: Patent Anatomic variants: Proximal basilar fenestration. Delayed phase: No abnormal intracranial enhancement Review of the MIP images confirms the above findings IMPRESSION: 1. No acute finding. 2. Mild atherosclerosis.  No flow limiting stenosis or ulceration. Electronically Signed   By:  Marnee Spring M.D.   On: 06/15/2018 19:18   Ct Head Wo Contrast  Result Date: 06/15/2018 CLINICAL DATA:  57 year old male with bilateral extremity numbness and slurred speech for 5 days. EXAM: CT HEAD WITHOUT CONTRAST TECHNIQUE:  Contiguous axial images were obtained from the base of the skull through the vertex without intravenous contrast. COMPARISON:  None. FINDINGS: Brain: A few scattered subcortical hypodense areas in the frontoparietal regions bilaterally noted, at least 1 on the RIGHT and 2 on the LEFT. Very mild periventricular white matter hypodensities noted. There is no evidence of hemorrhage, hydrocephalus, mass effect, midline shift, extra-axial collection or cortical infarct. Vascular: No hyperdense vessel or unexpected calcification. Skull: Normal. Negative for fracture or focal lesion. Sinuses/Orbits: No acute finding. Other: None. IMPRESSION: 1. Nonspecific subcortical white matter hypodensities as discussed above. Consider MRI for further evaluation. 2. Very mild periventricular white matter hypodensities, more likely to represent chronic small-vessel white matter ischemic changes Electronically Signed   By: Harmon Pier M.D.   On: 06/15/2018 12:26   Ct Angio Neck W Or Wo Contrast  Result Date: 06/15/2018 CLINICAL DATA:  TIA. Thick, slurred speech. Gait imbalance and dizziness. EXAM: CT ANGIOGRAPHY HEAD AND NECK TECHNIQUE: Multidetector CT imaging of the head and neck was performed using the standard protocol during bolus administration of intravenous contrast. Multiplanar CT image reconstructions and MIPs were obtained to evaluate the vascular anatomy. Carotid stenosis measurements (when applicable) are obtained utilizing NASCET criteria, using the distal internal carotid diameter as the denominator. CONTRAST:  50mL ISOVUE-370 IOPAMIDOL (ISOVUE-370) INJECTION 76% COMPARISON:  Brain MRI earlier today FINDINGS: CTA NECK FINDINGS Aortic arch: Normal arch.  Three vessel branching. Right carotid system: Motion artifact at the level of the common carotid. Mild atheromatous wall thickening and calcification at the common carotid bifurcation. No ulceration or stenosis. Left carotid system: Mild partially calcified plaque along  the distal common carotid and at the common carotid bifurcation. Vertebral arteries: Mild low-density plaque at the proximal left subclavian. Calcified plaque at the bilateral vertebral origins with mild narrowing. Vessels are smooth and widely patent to the dura. Skeleton: No acute or aggressive finding. Other neck: No evident mass or inflammation. Upper chest: Paraseptal emphysema. Review of the MIP images confirms the above findings CTA HEAD FINDINGS Anterior circulation: Vessels are smooth and widely patent. Minimal calcified plaque at the left cavernous sinus. Negative for aneurysm. Posterior circulation: Codominant vertebral arteries. Hypoplastic right P1 segment. No branch occlusion or flow limiting stenosis. Negative for aneurysm. Venous sinuses: Patent Anatomic variants: Proximal basilar fenestration. Delayed phase: No abnormal intracranial enhancement Review of the MIP images confirms the above findings IMPRESSION: 1. No acute finding. 2. Mild atherosclerosis.  No flow limiting stenosis or ulceration. Electronically Signed   By: Marnee Spring M.D.   On: 06/15/2018 19:18   Mr Laqueta Jean And Wo Contrast  Result Date: 06/15/2018 CLINICAL DATA:  Ataxia with stroke suspected EXAM: MRI HEAD WITHOUT AND WITH CONTRAST TECHNIQUE: Multiplanar, multiecho pulse sequences of the brain and surrounding structures were obtained without and with intravenous contrast. CONTRAST:  18mL MULTIHANCE GADOBENATE DIMEGLUMINE 529 MG/ML IV SOLN COMPARISON:  06/15/2018 FINDINGS: Brain: Numerous FLAIR hyperintense foci in the cerebral white matter mainly in the deep and juxta cortical regions. There is no periventricular predilection or infratentorial involvement. No black holes or enhancement. These can be seen with old microvascular insults, demyelinating process, trauma (unlikely in the absence of chronic blood products or cortical gliosis), or remote infectious/inflammatory process. Normal brain volume. No acute infarct,  hemorrhage, hydrocephalus, or mass Vascular: Major flow voids and vascular enhancements are preserved Skull and upper cervical spine: No evidence of marrow lesion Sinuses/Orbits: Prominent rightward nasal septal deviation and spurring. IMPRESSION: 1. Multiple small signal abnormalities in the cerebral white matter with nonspecific pattern. Lack of enhancement favors a chronic process. These could be from remote ischemia, demyelination, or infection. 2. No acute finding to explain symptoms. Electronically Signed   By: Marnee SpringJonathon  Watts M.D.   On: 06/15/2018 16:32   Dg Chest Port 1 View  Result Date: 06/15/2018 CLINICAL DATA:  Cough EXAM: PORTABLE CHEST 1 VIEW COMPARISON:  None. FINDINGS: Heart and mediastinal contours are within normal limits. No focal opacities or effusions. No acute bony abnormality. IMPRESSION: No active disease. Electronically Signed   By: Charlett NoseKevin  Dover M.D.   On: 06/15/2018 20:01       LAB RESULTS: Basic Metabolic Panel: Recent Labs  Lab 06/15/18 1140 06/15/18 1149  NA 138 139  K 3.8 3.7  CL 106 105  CO2 23  --   GLUCOSE 104* 102*  BUN 18 19  CREATININE 0.97 1.00  CALCIUM 9.1  --    Liver Function Tests: Recent Labs  Lab 06/15/18 1140  AST 38  ALT 73*  ALKPHOS 55  BILITOT 1.2  PROT 7.9  ALBUMIN 4.1   No results for input(s): LIPASE, AMYLASE in the last 168 hours. No results for input(s): AMMONIA in the last 168 hours. CBC: Recent Labs  Lab 06/15/18 1140 06/15/18 1149  WBC 5.9  --   NEUTROABS 3.1  --   HGB 15.2 15.0  HCT 44.8 44.0  MCV 89.2  --   PLT 217  --    Cardiac Enzymes: No results for input(s): CKTOTAL, CKMB, CKMBINDEX, TROPONINI in the last 168 hours. BNP: Invalid input(s): POCBNP CBG: No results for input(s): GLUCAP in the last 168 hours.    Disposition and Follow-up: Discharge Instructions    Ambulatory referral to Neurology   Complete by:  As directed    An appointment is requested in approximately: 2-3 Week(s): for  Guillain-Barr syndrome   Diet - low sodium heart healthy   Complete by:  As directed    Increase activity slowly   Complete by:  As directed        DISPOSITION: Home   DISCHARGE FOLLOW-UP Follow-up Information    Patrick MerlMartin, William C, PA-C. Schedule an appointment as soon as possible for a visit in 2 week(s).   Specialty:  Family Medicine Contact information: [address] [place] [id number] [phone]            Time coordinating discharge:  35 minutes  Signed:   Thad Rangeripudeep Thoren Hosang M.D. [facility] 06/19/2018, 1:24 PM Pager: [phone]

## 2018-06-20 DIAGNOSIS — G610 Guillain-Barre syndrome: Secondary | ICD-10-CM | POA: Diagnosis not present

## 2018-07-03 ENCOUNTER — Encounter: Payer: Self-pay | Admitting: Neurology

## 2018-07-03 ENCOUNTER — Ambulatory Visit: Payer: BLUE CROSS/BLUE SHIELD | Admitting: Neurology

## 2018-07-03 VITALS — BP 147/96 | HR 93 | Ht 69.0 in | Wt 206.0 lb

## 2018-07-03 DIAGNOSIS — G610 Guillain-Barre syndrome: Secondary | ICD-10-CM | POA: Insufficient documentation
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
## 2018-07-08 ENCOUNTER — Ambulatory Visit: Payer: BLUE CROSS/BLUE SHIELD | Admitting: Physician Assistant

## 2018-08-19 ENCOUNTER — Other Ambulatory Visit: Payer: Self-pay

## 2018-08-19 ENCOUNTER — Encounter: Payer: Self-pay | Admitting: Physician Assistant

## 2018-08-19 ENCOUNTER — Ambulatory Visit: Payer: BLUE CROSS/BLUE SHIELD | Admitting: Physician Assistant

## 2018-08-19 VITALS — BP 138/86 | HR 67 | Temp 97.5°F | Resp 16 | Ht 69.0 in | Wt 207.0 lb

## 2018-08-19 DIAGNOSIS — Z8669 Personal history of other diseases of the nervous system and sense organs: Secondary | ICD-10-CM

## 2018-08-19 DIAGNOSIS — R310 Gross hematuria: Secondary | ICD-10-CM | POA: Diagnosis not present

## 2018-08-19 LAB — POCT URINALYSIS DIPSTICK
BILIRUBIN UA: NEGATIVE
Glucose, UA: NEGATIVE
KETONES UA: NEGATIVE
Leukocytes, UA: NEGATIVE
Nitrite, UA: NEGATIVE
PH UA: 5 (ref 5.0–8.0)
Protein, UA: POSITIVE — AB
RBC UA: NEGATIVE
Spec Grav, UA: >=1.03 — AB (ref 1.010–1.025)
UROBILINOGEN UA: 0.2 U/dL

## 2018-08-19 LAB — COMPREHENSIVE METABOLIC PANEL
ALBUMIN: 4.6 g/dL (ref 3.5–5.2)
ALT: 161 U/L — ABNORMAL HIGH (ref 0–53)
AST: 50 U/L — ABNORMAL HIGH (ref 0–37)
Alkaline Phosphatase: 70 U/L (ref 39–117)
BILIRUBIN TOTAL: 0.9 mg/dL (ref 0.2–1.2)
BUN: 23 mg/dL (ref 6–23)
CALCIUM: 9.9 mg/dL (ref 8.4–10.5)
CHLORIDE: 102 meq/L (ref 96–112)
CO2: 28 meq/L (ref 19–32)
Creatinine, Ser: 0.94 mg/dL (ref 0.40–1.50)
GFR: 87.85 mL/min (ref >60.00)
Glucose, Bld: 93 mg/dL (ref 70–99)
Potassium: 3.9 mEq/L (ref 3.5–5.1)
Sodium: 138 mEq/L (ref 135–145)
Total Protein: 7.9 g/dL (ref 6.0–8.3)

## 2018-08-19 LAB — CBC WITH DIFFERENTIAL/PLATELET
BASOS ABS: 0 10*3/uL (ref 0.0–0.1)
Basophils Relative: 0.9 % (ref 0.0–3.0)
Eosinophils Absolute: 0.4 10*3/uL (ref 0.0–0.7)
Eosinophils Relative: 8.1 % — ABNORMAL HIGH (ref 0.0–5.0)
HEMATOCRIT: 45.7 % (ref 39.0–52.0)
Hemoglobin: 15.9 g/dL (ref 13.0–17.0)
LYMPHS PCT: 36.1 % (ref 12.0–46.0)
Lymphs Abs: 1.7 10*3/uL (ref 0.7–4.0)
MCHC: 34.8 g/dL (ref 30.0–36.0)
MCV: 89.4 fl (ref 78.0–100.0)
MONOS PCT: 8.7 % (ref 3.0–12.0)
Monocytes Absolute: 0.4 10*3/uL (ref 0.1–1.0)
NEUTROS ABS: 2.2 10*3/uL (ref 1.4–7.7)
Neutrophils Relative %: 46.2 % (ref 43.0–77.0)
PLATELETS: 200 10*3/uL (ref 150.0–400.0)
RBC: 5.12 Mil/uL (ref 4.22–5.81)
RDW: 13.7 % (ref 11.5–15.5)
WBC: 4.7 10*3/uL (ref 4.0–10.5)

## 2018-08-19 LAB — PSA: PSA: 3.16 ng/mL (ref 0.10–4.00)

## 2018-08-19 NOTE — Progress Notes (Signed)
Patient presents to clinic today c/o urinary frequency and urgency with some occasional urinary dribbling x 1+ years. Notes intermittent blood in his urine, sometimes with clotting. Denies fever, chills, malaise. Denies abdominal pain, nausea or vomiting. Has + family history of prostate cancer. Patient has presented to clinic with these complaints before and was sent to Urology for further assessment. Is a former smoker, quitting in 1995. Feels convinced he has a urinary tract infection causing his symptoms for the past year.   Past Medical History:  Diagnosis Date  . GERD (gastroesophageal reflux disease)   . Guillain Barr syndrome [facility])     Current Outpatient Medications on File Prior to Visit  Medication Sig Dispense Refill  . losartan (COZAAR) 25 MG tablet Take 1 tablet (25 mg total) by mouth daily. 30 tablet 3  . pantoprazole (PROTONIX) 40 MG tablet Take 1 tablet (40 mg total) by mouth daily. (Patient taking differently: Take 40 mg by mouth daily at 2 PM. ) 90 tablet 1  . vitamin B-12 (CYANOCOBALAMIN) 1000 MCG tablet Take 1,000 mcg by mouth daily.     No current facility-administered medications on file prior to visit.     No Known Allergies  Family History  Problem Relation Age of Onset  . Hypertension Mother   . Asthma Mother   . Liver cancer Mother   . Prostate cancer Brother 77  . Other Father        unsure of history    Social History   Socioeconomic History  . Marital status: Married    Spouse name: Not on file  . Number of children: 3  . Years of education: 4  . Highest education level: High school graduate  Occupational History  . Occupation: self employed - land lord  Social Needs  . Financial resource strain: Not on file  . Food insecurity:    Worry: Not on file    Inability: Not on file  . Transportation needs:    Medical: Not on file    Non-medical: Not on file  Tobacco Use  . Smoking status: Former Smoker    Last attempt to quit: 1995   Years since quitting: 24.7  . Smokeless tobacco: Never Used  Substance and Sexual Activity  . Alcohol use: Not Currently  . Drug use: Never  . Sexual activity: Yes  Lifestyle  . Physical activity:    Days per week: Not on file    Minutes per session: Not on file  . Stress: Not on file  Relationships  . Social connections:    Talks on phone: Not on file    Gets together: Not on file    Attends religious service: Not on file    Active member of club or organization: Not on file    Attends meetings of clubs or organizations: Not on file    Relationship status: Not on file  Other Topics Concern  . Not on file  Social History Narrative   Lives at home with his wife.   Right-handed.   2 cups caffeine per day.    Review of Systems - See HPI.  All other ROS are negative.  BP 138/86   Pulse 67   Temp (!) 97.5 F (36.4 C) (Oral)   Resp 16   Ht _0  (1.753 m)   Wt 207 lb (93.9 kg)   SpO2 98%   BMI 30.57 kg/m   Physical Exam  Constitutional: He is oriented to person, place, and time. He  appears well-developed and well-nourished.  HENT:  Head: Normocephalic and atraumatic.  Eyes: Conjunctivae are normal.  Neck: Neck supple.  Cardiovascular: Normal rate, regular rhythm, normal heart sounds and intact distal pulses.  Pulmonary/Chest: Effort normal and breath sounds normal.  Abdominal: Soft. Bowel sounds are normal. He exhibits no distension. There is no tenderness.  Negative CVA tenderness.  Neurological: He is alert and oriented to person, place, and time. No cranial nerve deficit.  Psychiatric: He has a normal mood and affect.  Vitals reviewed.   Recent Results (from the past 2160 hour(s))  Protime-INR     Status: None   Collection Time: 06/15/18 11:40 AM  Result Value Ref Range   Prothrombin Time 13.4 11.4 - 15.2 seconds   INR 1.03     Comment: Performed at [facility] [address] [place], [place] [address]  APTT     Status: None   Collection Time:  06/15/18 11:40 AM  Result Value Ref Range   aPTT 28 24 - 36 seconds    Comment: Performed at [facility] [address] [place] [address]  CBC     Status: None   Collection Time: 06/15/18 11:40 AM  Result Value Ref Range   WBC 5.9 4.0 - 10.5 K/uL   RBC 5.02 4.22 - 5.81 MIL/uL   Hemoglobin 15.2 13.0 - 17.0 g/dL   HCT 44.8 39.0 - 52.0 %   MCV 89.2 78.0 - 100.0 fL   MCH 30.3 26.0 - 34.0 pg   MCHC 33.9 30.0 - 36.0 g/dL   RDW 12.3 11.5 - 15.5 %   Platelets 217 150 - 400 K/uL    Comment: Performed at [facility], [place] [address] [place], [place] [address]  Differential     Status: None   Collection Time: 06/15/18 11:40 AM  Result Value Ref Range   Neutrophils Relative % 54 %   Neutro Abs 3.1 1.7 - 7.7 K/uL   Lymphocytes Relative 34 %   Lymphs Abs 2.0 0.7 - 4.0 K/uL   Monocytes Relative 7 %   Monocytes Absolute 0.4 0.1 - 1.0 K/uL   Eosinophils Relative 4 %   Eosinophils Absolute 0.3 0.0 - 0.7 K/uL   Basophils Relative 1 %   Basophils Absolute 0.0 0.0 - 0.1 K/uL   Immature Granulocytes 0 %   Abs Immature Granulocytes 0.0 0.0 - 0.1 K/uL    Comment: Performed at [facility] [address], [place], [place] [address]  Comprehensive metabolic panel     Status: Abnormal   Collection Time: 06/15/18 11:40 AM  Result Value Ref Range   Sodium 138 135 - 145 mmol/L   Potassium 3.8 3.5 - 5.1 mmol/L   Chloride 106 98 - 111 mmol/L   CO2 23 22 - 32 mmol/L   Glucose, Bld 104 (H) 70 - 99 mg/dL   BUN 18 6 - 20 mg/dL   Creatinine, Ser 0.97 0.61 - 1.24 mg/dL   Calcium 9.1 8.9 - 10.3 mg/dL   Total Protein 7.9 6.5 - 8.1 g/dL   Albumin 4.1 3.5 - 5.0 g/dL   AST 38 15 - 41 U/L   ALT 73 (H) 0 - 44 U/L   Alkaline Phosphatase 55 38 - 126 U/L   Total Bilirubin 1.2 0.3 - 1.2 mg/dL   GFR calc non Af Amer >60 >60 mL/min   GFR calc Af Amer >60 >60 mL/min    Comment: (NOTE) The eGFR has  been calculated using the CKD EPI equation. This calculation has not been validated in  all clinical situations. eGFR's persistently <60 mL/min signify possible Chronic Kidney Disease.    Anion gap 9 5 - 15    Comment: Performed at [facility] [address] [place], [place] [address]  I-stat troponin, ED     Status: None   Collection Time: 06/15/18 11:48 AM  Result Value Ref Range   Troponin i, poc 0.00 0.00 - 0.08 ng/mL   Comment 3            Comment: Due to the release kinetics of cTnI, a negative result within the first hours of the onset of symptoms does not rule out myocardial infarction with certainty. If myocardial infarction is still suspected, repeat the test at appropriate intervals.   I-Stat Chem 8, ED     Status: Abnormal   Collection Time: 06/15/18 11:49 AM  Result Value Ref Range   Sodium 139 135 - 145 mmol/L   Potassium 3.7 3.5 - 5.1 mmol/L   Chloride 105 98 - 111 mmol/L   BUN 19 6 - 20 mg/dL   Creatinine, Ser 1.00 0.61 - 1.24 mg/dL   Glucose, Bld 102 (H) 70 - 99 mg/dL   Calcium, Ion 1.16 1.15 - 1.40 mmol/L   TCO2 24 22 - 32 mmol/L   Hemoglobin 15.0 13.0 - 17.0 g/dL   HCT 44.0 39.0 - 52.0 %  Urine rapid drug screen (hosp performed)     Status: None   Collection Time: 06/15/18  2:59 PM  Result Value Ref Range   Opiates NONE DETECTED NONE DETECTED   Cocaine NONE DETECTED NONE DETECTED   Benzodiazepines NONE DETECTED NONE DETECTED   Amphetamines NONE DETECTED NONE DETECTED   Tetrahydrocannabinol NONE DETECTED NONE DETECTED   Barbiturates NONE DETECTED NONE DETECTED    Comment: (NOTE) DRUG SCREEN FOR MEDICAL PURPOSES ONLY.  IF CONFIRMATION IS NEEDED FOR ANY PURPOSE, NOTIFY LAB WITHIN 5 DAYS. LOWEST DETECTABLE LIMITS FOR URINE DRUG SCREEN Drug Class                     Cutoff (ng/mL) Amphetamine and metabolites    1000 Barbiturate and metabolites    200 Benzodiazepine                 032 Tricyclics and metabolites     300 Opiates and metabolites        300 Cocaine and metabolites        300 THC                             50 Performed at [facility], [place] [address], [place], [place] [address]   Urinalysis, Routine w reflex microscopic     Status: None   Collection Time: 06/15/18  2:59 PM  Result Value Ref Range   Color, Urine YELLOW YELLOW   APPearance CLEAR CLEAR   Specific Gravity, Urine 1.011 1.005 - 1.030   pH 6.0 5.0 - 8.0   Glucose, UA NEGATIVE NEGATIVE mg/dL   Hgb urine dipstick NEGATIVE NEGATIVE   Bilirubin Urine NEGATIVE NEGATIVE   Ketones, ur NEGATIVE NEGATIVE mg/dL   Protein, ur NEGATIVE NEGATIVE mg/dL   Nitrite NEGATIVE NEGATIVE   Leukocytes, UA NEGATIVE NEGATIVE    Comment: Performed at [facility] [address], [place], [place] [address]  CSF cell count with differential collection tube #:  1     Status: Abnormal   Collection Time: 06/15/18  7:45 PM  Result Value Ref Range   Tube # 1    Color, CSF COLORLESS COLORLESS   Appearance, CSF CLEAR (A) CLEAR   Supernatant NOT INDICATED    RBC Count, CSF 9 (H) 0 /cu mm   WBC, CSF 2 0 - 5 /cu mm   Other Cells, CSF TOO FEW TO COUNT, SMEAR AVAILABLE FOR REVIEW     Comment: RARE LYMPHOCYTE Performed at [facility] [address] [place], [place] [address]   CSF cell count with differential collection tube #: 4     Status: Abnormal   Collection Time: 06/15/18  7:45 PM  Result Value Ref Range   Tube # 4    Color, CSF COLORLESS COLORLESS   Appearance, CSF CLEAR (A) CLEAR   Supernatant NOT INDICATED    RBC Count, CSF 17 (H) 0 /cu mm   WBC, CSF 1 0 - 5 /cu mm   Other Cells, CSF TOO FEW TO COUNT, SMEAR AVAILABLE FOR REVIEW     Comment: RARE LYMPHOCYTE Performed at [facility] [address], [place], [place] [address]   CSF culture     Status: None   Collection Time: 06/15/18  7:45 PM  Result Value Ref Range   Specimen Description CSF    Special Requests TUBE 2    Gram Stain      WBC PRESENT, PREDOMINANTLY MONONUCLEAR NO ORGANISMS SEEN CYTOSPIN SMEAR    Culture      NO GROWTH 3 DAYS Performed at  [facility] [address], [place], [place] [address]    Report Status 06/19/2018 FINAL   Glucose, CSF     Status: None   Collection Time: 06/15/18  7:45 PM  Result Value Ref Range   Glucose, CSF 55 40 - 70 mg/dL    Comment: Performed at [facility] [address], [place], [place] [address]  Protein, CSF     Status: Abnormal   Collection Time: 06/15/18  7:45 PM  Result Value Ref Range   Total  Protein, CSF 76 (H) 15 - 45 mg/dL    Comment: Performed at [facility] [address] [place], [place] [address]  Herpes simplex virus (HSV), DNA by PCR Cerebrospinal Fluid     Status: None   Collection Time: 06/15/18  7:53 PM  Result Value Ref Range   HSV 1 DNA Negative Negative   HSV 2 DNA Negative Negative    Comment: (NOTE) This test was developed and its performance characteristics determined by [employer]. It has not been cleared or approved by the U.S. Food and Drug Administration. The FDA has determined that such clearance or approval is not necessary. This test is used for clinical purposes. It should not be regarded as investigational or research. Performed At: [facility] [place], [place] [id number] Rush Farmer MD [id number]   Vitamin B12     Status: Abnormal   Collection Time: 06/15/18  9:08 PM  Result Value Ref Range   Vitamin B-12 2,305 (H) 180 - 914 pg/mL    Comment: (NOTE) This assay is not validated for testing neonatal or myeloproliferative syndrome specimens for Vitamin B12 levels. Performed at [facility], [place] [address] [place],  [address]   Folate     Status: None   Collection Time: 06/15/18  9:08 PM  Result Value Ref Range  Folate 38.0 >5.9 ng/mL    Comment: Performed at [facility], [place] [address], [place], [place] [address]  RPR     Status: None   Collection Time: 06/15/18  9:08 PM  Result Value Ref Range   RPR Ser Ql Non Reactive Non Reactive    Comment:  (NOTE) Performed At: [facility] [address] [place], [place] [id number] Rush Farmer MD [id number]   ANA, IFA (with reflex)     Status: None   Collection Time: 06/15/18  9:08 PM  Result Value Ref Range   ANA Ab, IFA Negative     Comment: (NOTE)                                     Negative   <1:80                                     Borderline  1:80                                     Positive   >1:80 Performed At: [facility] [place], [place] [id number] Rush Farmer MD [id number]   Sedimentation rate     Status: None   Collection Time: 06/15/18  9:08 PM  Result Value Ref Range   Sed Rate 7 0 - 16 mm/hr    Comment: Performed at [facility], [place] [address] [place], [place] [address]  C-reactive protein     Status: None   Collection Time: 06/15/18  9:08 PM  Result Value Ref Range   CRP <0.8 <1.0 mg/dL    Comment: Performed at [facility], [place] [address], [place], [place] [address]  HIV antibody     Status: None   Collection Time: 06/15/18  9:08 PM  Result Value Ref Range   HIV Screen 4th Generation wRfx Non Reactive Non Reactive    Comment: (NOTE) Performed At: [facility] [place], [place] [id number] Rush Farmer MD [id number]   TSH     Status: None   Collection Time: 06/15/18  9:08 PM  Result Value Ref Range   TSH 1.962 0.350 - 4.500 uIU/mL    Comment: Performed by a 3rd Generation assay with a functional sensitivity of <=0.01 uIU/mL. Performed at [facility], [place] [address], [place], [place] [address]   Hemoglobin A1c     Status: None   Collection Time: 06/16/18  6:06 AM  Result Value Ref Range   Hgb A1c MFr Bld 5.5 4.8 - 5.6 %    Comment: (NOTE) Pre diabetes:          5.7%-6.4% Diabetes:              >6.4% Glycemic control for   <7.0% adults with diabetes    Mean Plasma Glucose 111.15 mg/dL    Comment: Performed at [facility] [address],  [place], [place] [address]  Lipid panel     Status: Abnormal   Collection Time: 06/16/18  6:06 AM  Result Value Ref Range   Cholesterol 157 0 - 200 mg/dL   Triglycerides 290 (H) <150 mg/dL   HDL 26 (L) >40 mg/dL   Total CHOL/HDL Ratio  6.0 RATIO   VLDL 58 (H) 0 - 40 mg/dL   LDL Cholesterol 73 0 - 99 mg/dL    Comment:        Total Cholesterol/HDL:CHD Risk Coronary Heart Disease Risk Table                     Men   Women  1/2 Average Risk   3.4   3.3  Average Risk       5.0   4.4  2 X Average Risk   9.6   7.1  3 X Average Risk  23.4   11.0        Use the calculated Patient Ratio above and the CHD Risk Table to determine the patient's CHD Risk.        ATP III CLASSIFICATION (LDL):  <100     mg/dL   Optimal  100-129  mg/dL   Near or Above                    Optimal  130-159  mg/dL   Borderline  160-189  mg/dL   High  >190     mg/dL   Very High Performed at [facility] [address] [place], [place] [address]   Vitamin B1     Status: None   Collection Time: 06/16/18  6:06 AM  Result Value Ref Range   Vitamin B1 (Thiamine) 151.1 66.5 - 200.0 nmol/L    Comment: (NOTE) This test was developed and its performance characteristics determined by [facility]. It has not been cleared or approved by the Food and Drug Administration. Performed At: [facility] [place], [place] [id number] Rush Farmer MD [id number]   ECHOCARDIOGRAM COMPLETE     Status: None   Collection Time: 06/16/18 12:33 PM  Result Value Ref Range   Weight 3,347.46 oz   Height 69 in   BP 150/94 mmHg  POCT Urinalysis Dipstick     Status: Abnormal   Collection Time: 08/19/18  9:10 AM  Result Value Ref Range   Color, UA yellow    Clarity, UA clear    Glucose, UA Negative Negative   Bilirubin, UA negative    Ketones, UA negative    Spec Grav, UA >=1.030 (A) 1.010 - 1.025   Blood, UA negative    pH, UA 5.0 5.0 - 8.0   Protein, UA Positive (A) Negative    Comment: trace    Urobilinogen, UA 0.2 0.2 or 1.0 E.U./dL   Nitrite, UA negative    Leukocytes, UA Negative Negative   Appearance     Odor      Assessment/Plan: 1. Gross hematuria Patient with gross hematuria and + family hx of prostate cancer. Was previously sent to Urology by this provider. Notes reviewed in EMR where Urology recommend CT and cystoscopy but patient never had this completed. He also never followed up for repeat PSA level. Urine dip negative. Will send for culture. Will obtain CBC w diff, CMP and PSA today. He is now agreeable to further assessment. Will obtain CT ASAP to assess as he is high risk for malignancy. Will set back up with Urology pending CT results.  - POCT Urinalysis Dipstick - Urine Culture - Comp Met (CMET) - CT RENAL ABD W/WO; Future - CBC w/Diff - PSA  2. Hx of Guillain-Barre syndrome Patient with recent diagnosis, treated well with IVIG. Occasional residual tingling noted of fingers. Neurology had previously recommended nerve conduction study  which he had initially declined. Recommended he follow-up with neurology to discuss residual symptoms.  - CBC w/Diff    Leeanne Rio, PA-C

## 2018-08-19 NOTE — Patient Instructions (Addendum)
Please go to the lab today for blood work.  I will call you with your results. We will alter treatment regimen(s) if indicated by your results.   Please stay well-hydrated and get plenty of rest.  Limit caffeine as it is a bladder irritant.  Limit anti-inflammatories -- no Advil or BC powders.  Tylenol if needed for low back pain. Continue heating pad  Urine is negative for infection.  I am sending for culture to verify. Giving ongoing symptoms and blood in the urine, we need imaging and further assessment. Giving smoking history, we have to rule out bladder/kidney cancers.   I am also setting you up with Urology for further assessment.  The mild tingling in fingers can potentially be related to anxiety/concern about prior issues, but I want you to schedule follow-up with your Neurologist.    Hematuria, Adult Hematuria is blood in your urine. It can be caused by a bladder infection, kidney infection, prostate infection, kidney stone, or cancer of your urinary tract. Infections can usually be treated with medicine, and a kidney stone usually will pass through your urine. If neither of these is the cause of your hematuria, further workup to find out the reason may be needed. It is very important that you tell your health care provider about any blood you see in your urine, even if the blood stops without treatment or happens without causing pain. Blood in your urine that happens and then stops and then happens again can be a symptom of a very serious condition. Also, pain is not a symptom in the initial stages of many urinary cancers. Follow these instructions at home:  Drink lots of fluid, 3-4 quarts a day. If you have been diagnosed with an infection, cranberry juice is especially recommended, in addition to large amounts of water.  Avoid caffeine, tea, and carbonated beverages because they tend to irritate the bladder.  Avoid alcohol because it may irritate the prostate.  Take all  medicines as directed by your health care provider.  If you were prescribed an antibiotic medicine, finish it all even if you start to feel better.  If you have been diagnosed with a kidney stone, follow your health care provider's instructions regarding straining your urine to catch the stone.  Empty your bladder often. Avoid holding urine for long periods of time.  After a bowel movement, women should cleanse front to back. Use each tissue only once.  Empty your bladder before and after sexual intercourse if you are a male. Contact a health care provider if:  You develop back pain.  You have a fever.  You have a feeling of sickness in your stomach (nausea) or vomiting.  Your symptoms are not better in 3 days. Return sooner if you are getting worse. Get help right away if:  You develop severe vomiting and are unable to keep the medicine down.  You develop severe back or abdominal pain despite taking your medicines.  You begin passing a large amount of blood or clots in your urine.  You feel extremely weak or faint, or you pass out. This information is not intended to replace advice given to you by your health care provider. Make sure you discuss any questions you have with your health care provider. Document Released: 11/06/2005 Document Revised: 04/13/2016 Document Reviewed: 07/07/2013 Elsevier Interactive Patient Education  2017 [employer].

## 2018-08-20 ENCOUNTER — Other Ambulatory Visit: Payer: BLUE CROSS/BLUE SHIELD

## 2018-08-20 DIAGNOSIS — B182 Chronic viral hepatitis C: Secondary | ICD-10-CM | POA: Diagnosis not present

## 2018-08-20 DIAGNOSIS — R7989 Other specified abnormal findings of blood chemistry: Secondary | ICD-10-CM

## 2018-08-20 DIAGNOSIS — R945 Abnormal results of liver function studies: Secondary | ICD-10-CM

## 2018-08-21 LAB — URINE CULTURE
MICRO NUMBER:: 91171353
Result:: NO GROWTH
SPECIMEN QUALITY:: ADEQUATE

## 2018-08-22 ENCOUNTER — Other Ambulatory Visit: Payer: Self-pay | Admitting: Physician Assistant

## 2018-08-22 DIAGNOSIS — R768 Other specified abnormal immunological findings in serum: Secondary | ICD-10-CM

## 2018-08-22 DIAGNOSIS — R310 Gross hematuria: Secondary | ICD-10-CM

## 2018-08-23 LAB — HEPATITIS PANEL, ACUTE
HEP A IGM: NONREACTIVE
HEP B S AG: NONREACTIVE
Hep B C IgM: NONREACTIVE
Hepatitis C Ab: REACTIVE — AB
SIGNAL TO CUT-OFF: 32.9 — AB (ref <1.00)

## 2018-08-23 LAB — HCV RNA,QUANTITATIVE REAL TIME PCR
HCV Quantitative Log: 5.9 Log IU/mL — ABNORMAL HIGH
HCV RNA, PCR, QN: 802000 IU/mL — ABNORMAL HIGH

## 2018-08-28 ENCOUNTER — Encounter: Payer: Self-pay | Admitting: Family

## 2018-08-28 ENCOUNTER — Ambulatory Visit (INDEPENDENT_AMBULATORY_CARE_PROVIDER_SITE_OTHER): Payer: BLUE CROSS/BLUE SHIELD | Admitting: Family

## 2018-08-28 VITALS — BP 144/91 | HR 82 | Temp 97.7°F | Ht 69.0 in | Wt 206.0 lb

## 2018-08-28 DIAGNOSIS — B182 Chronic viral hepatitis C: Secondary | ICD-10-CM | POA: Diagnosis not present
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
## 2018-08-28 NOTE — Progress Notes (Signed)
Patient meet with Patrick Wood to schedule U/S. Blood work completed today.

## 2018-08-28 NOTE — Assessment & Plan Note (Addendum)
Patrick Wood has chronic Hepatitis C with a viral load of 802,000. His risk factors include previous history of IVDU but he is insistent it was not. He is asymptomatic at present. His mother died from liver failure and cirrhosis. Check elastography and genotype type. Discussed transmission, risk of progression and treatments. Plan for treatment with Mavyret or Epclusa pending blood work and imaging results.  He declined immunizations today.

## 2018-08-28 NOTE — Patient Instructions (Signed)
Nice to meet you.   We will check your blood work today and schedule you for an ultrasound.  Limit acetaminophen (Tylenol) usage to no more than 2 grams (2,000 mg) per day.  Avoid alcohol.  Do not share toothbrushes or razors.  Practice safe sex to protect against transmission as well as sexually transmitted disease.    Hepatitis C Hepatitis C is a viral infection of the liver. It can lead to scarring of the liver (cirrhosis), liver failure, or liver cancer. Hepatitis C may go undetected for months or years because people with the infection may not have symptoms, or they may have only mild symptoms. What are the causes? This condition is caused by the hepatitis C virus (HCV). The virus can spread from person to person (is contagious) through:  Blood.  Childbirth. A woman who has hepatitis C can pass it to her baby during birth.  Bodily fluids, such as breast milk, tears, semen, vaginal fluids, and saliva.  Blood transfusions or organ transplants done in the [place] before 1992.  What increases the risk? The following factors may make you more likely to develop this condition:  Having contact with unclean (contaminated) needles or syringes. This may result from: ? Acupuncture. ? Tattoing. ? Body piercing. ? Injecting drugs.  Having unprotected sex with someone who is infected.  Needing treatment to filter your blood (kidney dialysis).  Having HIV (human immunodeficiency virus) or AIDS (acquired immunodeficiency syndrome).  Working in a job that involves contact with blood or bodily fluids, such as health care.  What are the signs or symptoms? Symptoms of this condition include:  Fatigue.  Loss of appetite.  Nausea.  Vomiting.  Abdominal pain.  Dark yellow urine.  Yellowish skin and eyes (jaundice).  Itchy skin.  Clay-colored bowel movements.  Joint pain.  Bleeding and bruising easily.  Fluid building up in your stomach (ascites).  In some  cases, you may not have any symptoms. How is this diagnosed? This condition is diagnosed with:  Blood tests.  Other tests to check how well your liver is functioning. They may include: ? Magnetic resonance elastography (MRE). This imaging test uses MRIs and sound waves to measure liver stiffness. ? Transient elastography. This imaging test uses ultrasounds to measure liver stiffness. ? Liver biopsy. This test requires taking a small tissue sample from your liver to examine it under a microscope.  How is this treated? Your health care provider may perform noninvasive tests or a liver biopsy to help decide the best course of treatment. Treatment may include:  Antiviral medicines and other medicines.  Follow-up treatments every 6-12 months for infections or other liver conditions.  Receiving a donated liver (liver transplant).  Follow these instructions at home: Medicines  Take over-the-counter and prescription medicines only as told by your health care provider.  Take your antiviral medicine as told by your health care provider. Do not stop taking the antiviral even if you start to feel better.  Do not take any medicines unless approved by your health care provider, including over-the-counter medicines and birth control pills. Activity  Rest as needed.  Do not have sex unless approved by your health care provider.  Ask your health care provider when you may return to school or work. Eating and drinking  Eat a balanced diet with plenty of fruits and vegetables, whole grains, and lowfat (lean) meats or non-meat proteins (such as beans or tofu).  Drink enough fluids to keep your urine clear or pale yellow.    Do not drink alcohol. General instructions  Do not share toothbrushes, nail clippers, or razors.  Wash your hands frequently with soap and water. If soap and water are not available, use hand sanitizer.  Cover any cuts or open sores on your skin to prevent spreading the  virus.  Keep all follow-up visits as told by your health care provider. This is important. You may need follow-up visits every 6-12 months. How is this prevented? There is no vaccine for hepatitis C. The only way to prevent the disease is to reduce the risk of exposure to the virus. Make sure you:  Wash your hands frequently with soap and water. If soap and water are not available, use hand sanitizer.  Do not share needles or syringes.  Practice safe sex and use condoms.  Avoid handling blood or bodily fluids without gloves or other protection.  Avoid getting tattoos or piercings in shops or other locations that are not clean.  Contact a health care provider if:  You have a fever.  You develop abdominal pain.  You pass dark urine.  You pass clay-colored stools.  You develop joint pain. Get help right away if:  You have increasing fatigue or weakness.  You lose your appetite.  You cannot eat or drink without vomiting.  You develop jaundice or your jaundice gets worse.  You bruise or bleed easily. Summary  Hepatitis C is a viral infection of the liver. It can lead to scarring of the liver (cirrhosis), liver failure, or liver cancer.  The hepatitis C virus (HCV) causes this condition. The virus can pass from person to person (is contagious).  You should not take any medicines unless approved by your health care provider. This includes over-the-counter medicines and birth control pills. This information is not intended to replace advice given to you by your health care provider. Make sure you discuss any questions you have with your health care provider. Document Released: 11/03/2000 Document Revised: 12/12/2016 Document Reviewed: 12/12/2016 Elsevier Interactive Patient Education  [employer].

## 2018-08-30 ENCOUNTER — Encounter: Payer: BLUE CROSS/BLUE SHIELD | Admitting: Neurology

## 2018-08-30 ENCOUNTER — Ambulatory Visit (INDEPENDENT_AMBULATORY_CARE_PROVIDER_SITE_OTHER): Payer: BLUE CROSS/BLUE SHIELD | Admitting: Neurology

## 2018-08-30 DIAGNOSIS — G610 Guillain-Barre syndrome: Secondary | ICD-10-CM

## 2018-08-30 DIAGNOSIS — Z0289 Encounter for other administrative examinations: Secondary | ICD-10-CM

## 2018-08-30 NOTE — Procedures (Signed)
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  Summary of the tests:  Nerve conduction study:  Right sural, superficial peroneal, ulnar sensory responses were normal. Right peroneal to EDB, tibial, median and ulnar motor responses were normal.  Electromyography: Selective needle of right lower extremity muscles were normal.  Conclusion: There is no electrodiagnostic evidence of peripheral neuropathy or right lumbosacral radiculopathy.    ------------------------------- Levert Feinstein, M.D. PhD  [facility] [address] [place], [place] [address] Tel: [phone] Fax: [phone]        [facility]    Nerve / Sites Muscle Latency Ref. Amplitude Ref. Rel Amp Segments Distance Velocity Ref. Area    ms ms mV mV %  cm m/s m/s mVms  R Median - APB     Wrist APB 3.9 ?4.4 4.1 ?4.0 100 Wrist - APB 7   14.0     Upper arm APB 8.4  3.9  95.1 Upper arm - Wrist 23 51 ?49 14.9  R Ulnar - ADM     Wrist ADM 2.6 ?3.3 6.9 ?6.0 100 Wrist - ADM 7   17.8     B.Elbow ADM 6.9  5.3  76.8 B.Elbow - Wrist 22 50 ?49 14.2     A.Elbow ADM 8.9  4.7  88 A.Elbow - B.Elbow 10 51 ?49 13.0         A.Elbow - Wrist      R Peroneal - EDB     Ankle EDB 5.6 ?6.5 6.0 ?2.0 100 Ankle - EDB 9   22.1     Fib head EDB 12.5  5.3  88.9 Fib head - Ankle 32 47 ?44 22.8     Pop fossa EDB 14.7  5.1  95.2 Pop fossa - Fib head 10 45 ?44 22.3         Pop fossa - Ankle      R Tibial - AH     Ankle AH 3.8 ?5.8 12.4 ?4.0 100 Ankle - AH 9   26.4     Pop fossa AH 14.0  7.6  61.7 Pop fossa - Ankle 42 41 ?41 19.6             SNC    Nerve / Sites Rec. Site Peak Lat Ref.  Amp Ref. Segments Distance    ms ms V V  cm  R  Sural - Ankle (Calf)     Calf Ankle 4.3 ?4.4 8 ?6 Calf - Ankle 14  R Superficial peroneal - Ankle     Lat leg Ankle 4.3 ?4.4 6 ?6 Lat leg - Ankle 14  R Median - Orthodromic (Dig II, Mid palm)     Dig II Wrist 3.4 ?3.4 9 ?10 Dig II - Wrist 13  R Ulnar - Orthodromic, (Dig V, Mid palm)     Dig V Wrist 2.5 ?3.1 6 ?5 Dig V - Wrist 25              F  Wave    Nerve F Lat Ref.   ms ms  R Tibial - AH 54.8 ?56.0  R Ulnar - ADM 31.1 ?32.0         EMG full  EMG Summary Table    Spontaneous MUAP Recruitment  Muscle IA Fib PSW Fasc Other Amp Dur. Poly Pattern  R. Tibialis anterior Normal None None None _______ Normal Normal Normal Normal  R. Peroneus longus Normal None None None _______ Normal Normal Normal Normal  R. Gastrocnemius (Medial head) Normal None None None _______ Normal Normal Normal Normal  R. Vastus lateralis Normal None None None _______ Normal Normal Normal Normal

## 2018-08-31 LAB — HEPATITIS B SURFACE ANTIBODY,QUALITATIVE: Hep B S Ab: REACTIVE — AB

## 2018-08-31 LAB — HEPATITIS C GENOTYPE

## 2018-09-03 ENCOUNTER — Ambulatory Visit
Admission: RE | Admit: 2018-09-03 | Discharge: 2018-09-03 | Disposition: A | Discharge status: Home / Self Care | Payer: BLUE CROSS/BLUE SHIELD | Source: Ambulatory Visit | Attending: Physician Assistant | Admitting: Physician Assistant

## 2018-09-03 DIAGNOSIS — R310 Gross hematuria: Secondary | ICD-10-CM | POA: Diagnosis not present

## 2018-09-03 MED ORDER — IOPAMIDOL (ISOVUE-300) INJECTION 61%
125.0000 mL | Freq: Once | INTRAVENOUS | Status: AC | PRN
  Administered 2018-09-03: 125 mL via INTRAVENOUS

## 2018-09-04 ENCOUNTER — Ambulatory Visit (HOSPITAL_COMMUNITY)
Admission: RE | Admit: 2018-09-04 | Discharge: 2018-09-04 | Disposition: A | Discharge status: Home / Self Care | Payer: BLUE CROSS/BLUE SHIELD | Source: Ambulatory Visit | Attending: Family | Admitting: Family

## 2018-09-04 DIAGNOSIS — B182 Chronic viral hepatitis C: Secondary | ICD-10-CM | POA: Diagnosis not present

## 2018-09-04 NOTE — Progress Notes (Signed)
Please call patient: I have reviewed his/her lab results. Please let pt know cody is out of office; his ct doesn't show any worrisome findings. Please refer back to urology as discussed by reading his last note. Thanks.

## 2018-09-06 ENCOUNTER — Other Ambulatory Visit: Payer: Self-pay | Admitting: Family

## 2018-09-06 DIAGNOSIS — B182 Chronic viral hepatitis C: Secondary | ICD-10-CM

## 2018-09-06 MED ORDER — GLECAPREVIR-PIBRENTASVIR 100-40 MG PO TABS: 3.0000 | ORAL_TABLET | Freq: Every day | ORAL | 2 refills | Status: DC

## 2018-09-06 NOTE — Progress Notes (Signed)
Mr. Takeshita has Genotype 1a Chronic Hepatitis C with a viral load of 802,000. His elastography score is F2/F3. Will plan to treat with 12 weeks of Mavyret due to interactions of Epclusa and Harvoni with pantoprazole. Prescription will be sent to the pharmacy.

## 2018-09-09 ENCOUNTER — Telehealth: Payer: Self-pay

## 2018-09-09 NOTE — Telephone Encounter (Signed)
Please check with Kathie Rhodes as for medication availability. Thanks!

## 2018-09-09 NOTE — Telephone Encounter (Signed)
Patient's wife called stating [facility] did not have a prescription for Mavyret. Patient is not sure what to do next. Informed patient's wife that medication was sent to [facility] outpatient pharmacy, and that they should be able to pick up medication today. Patient is okay with medication being sent to [facility] long. [place], [place]

## 2018-09-10 ENCOUNTER — Other Ambulatory Visit: Payer: Self-pay | Admitting: Pharmacist

## 2018-09-10 DIAGNOSIS — B182 Chronic viral hepatitis C: Secondary | ICD-10-CM

## 2018-09-10 MED ORDER — GLECAPREVIR-PIBRENTASVIR 100-40 MG PO TABS: 3.0000 | ORAL_TABLET | Freq: Every day | ORAL | 1 refills | Status: DC

## 2018-09-12 ENCOUNTER — Telehealth: Payer: Self-pay | Admitting: Pharmacist

## 2018-09-12 ENCOUNTER — Encounter: Payer: Self-pay | Admitting: Pharmacy Technician

## 2018-09-12 MED FILL — MAVYRET 100-40 MG TABS: 100-40 | 28 days supply | Qty: 84 | Fill #0

## 2018-09-12 NOTE — Telephone Encounter (Signed)
Patient is approved for Mavyret x 8 weeks for his Hep C infection.  Discussed this with his wife (patient gave permission).  Explained how to take Mavyret and the importance of taking it daily with no missed does.  Emphasized the need to take all three pills together and not separate them out and to take them with food.  Counseled on possible side effects such as nausea, fatigue, and headaches. She will pick up today and he will start taking it tomorrow.

## 2018-10-04 MED FILL — MAVYRET 100-40 MG TABS: 100-40 | 28 days supply | Qty: 84 | Fill #1

## 2018-10-08 ENCOUNTER — Ambulatory Visit (INDEPENDENT_AMBULATORY_CARE_PROVIDER_SITE_OTHER): Payer: BLUE CROSS/BLUE SHIELD | Admitting: Pharmacist

## 2018-10-08 DIAGNOSIS — B182 Chronic viral hepatitis C: Secondary | ICD-10-CM

## 2018-10-08 NOTE — Progress Notes (Signed)
HPI: Patrick Wood is a 57 y.o. male presenting for hepatitis C follow up. He has genotype 1a, F2/F3 and started 8 weeks of Mavyret on 10/24.  Patient Active Problem List   Diagnosis Date Noted  . Chronic hepatitis C without hepatic coma (HCC) 08/28/2018  . 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 Stroke-like symptoms 06/15/2018  . Gastroesophageal reflux disease without esophagitis 07/03/2017  . Dysphagia 07/03/2017  . Family history of prostate cancer 07/03/2017  . Hematuria 07/03/2017    Patient's Medications  New Prescriptions   No medications on file  Previous Medications   GLECAPREVIR-PIBRENTASVIR (MAVYRET) 100-40 MG TABS    Take 3 tablets by mouth daily with breakfast.   LOSARTAN (COZAAR) 25 MG TABLET    Take 1 tablet (25 mg total) by mouth daily.   PANTOPRAZOLE (PROTONIX) 40 MG TABLET    Take 1 tablet (40 mg total) by mouth daily.   VITAMIN B-12 (CYANOCOBALAMIN) 1000 MCG TABLET    Take 1,000 mcg by mouth daily.  Modified Medications   No medications on file  Discontinued Medications   No medications on file    Allergies: No Known Allergies  Past Medical History: Past Medical History:  Diagnosis Date  . GERD (gastroesophageal reflux disease)   . Guillain Barr syndrome [facility])     Social History: Social History   Socioeconomic History  . Marital status: Married    Spouse name: Not on file  . Number of children: 3  . Years of education: 48  . Highest education level: High school graduate  Occupational History  . Occupation: self employed - land lord  Social Needs  . Financial resource strain: Not on file  . Food insecurity:    Worry: Not on file    Inability: Not on file  . Transportation needs:    Medical: Not on file    Non-medical: Not on file  Tobacco Use  . Smoking status: Former Smoker    Last attempt to quit: 1995    Years since quitting: 24.8  . Smokeless tobacco: Never Used  Substance and Sexual Activity    . Alcohol use: Not Currently  . Drug use: Never  . Sexual activity: Yes  Lifestyle  . Physical activity:    Days per week: Not on file    Minutes per session: Not on file  . Stress: Not on file  Relationships  . Social connections:    Talks on phone: Not on file    Gets together: Not on file    Attends religious service: Not on file    Active member of club or organization: Not on file    Attends meetings of clubs or organizations: Not on file    Relationship status: Not on file  Other Topics Concern  . Not on file  Social History Narrative   Lives at home with his wife.   Right-handed.   2 cups caffeine per day.    Labs: Hepatitis C Lab Results  Component Value Date   HCVGENOTYPE 1a 08/28/2018   HEPCAB REACTIVE (A) 08/20/2018   HCVRNAPCRQN 802,000 (H) 08/20/2018   Hepatitis B Lab Results  Component Value Date   HEPBSAB REACTIVE (A) 08/28/2018   HEPBSAG NON-REACTIVE 08/20/2018   Hepatitis A No results found for: HAV HIV Lab Results  Component Value Date   HIV Non Reactive 06/15/2018   Lab Results  Component Value Date   CREATININE 0.94 08/19/2018   CREATININE 1.00 06/15/2018   CREATININE 0.97  06/15/2018   CREATININE 0.93 07/03/2017   CREATININE 0.73 08/23/2011   Lab Results  Component Value Date   AST 50 (H) 08/19/2018   AST 38 06/15/2018   AST 27 07/03/2017   ALT 161 (H) 08/19/2018   ALT 73 (H) 06/15/2018   ALT 57 (H) 07/03/2017   INR 1.03 06/15/2018    Assessment: Patrick Wood is here for hepatitis C follow up. He started taking 8 weeks of Mavyret on 10/24. He says that it makes him "go to the bathroom," which after clarification is not diarrhea but just regular stools every morning. He has not noticed any other side effects and actually feels as if he has more energy. He is taking Mavyret with dinner and taking all 3 tablets at once. He has not missed any doses. He has 2 tablets of his first fill left and we gave him his second box during this visit.  Patrick Wood is immune to hepatitis B but not hepatitis A. We offered the hepatitis A vaccine series, as well as a flu shot, but he declined.   Plan: HCV RNA CMET F/u at [facility] on 12/19 at 1:45pm  Shirleymae Hauth N. Zigmund Danieleja, PharmD PGY2 Infectious Diseases Pharmacy Resident Phone: [phone] 10/08/2018, 4:15 PM

## 2018-10-10 LAB — COMPREHENSIVE METABOLIC PANEL
AG RATIO: 1.6 (calc) (ref 1.0–2.5)
ALBUMIN MSPROF: 4.4 g/dL (ref 3.6–5.1)
ALKALINE PHOSPHATASE (APISO): 56 U/L (ref 40–115)
ALT: 11 U/L (ref 9–46)
AST: 15 U/L (ref 10–35)
BILIRUBIN TOTAL: 1.1 mg/dL (ref 0.2–1.2)
BUN: 19 mg/dL (ref 7–25)
CALCIUM: 9.4 mg/dL (ref 8.6–10.3)
CHLORIDE: 102 mmol/L (ref 98–110)
CO2: 26 mmol/L (ref 20–32)
Creat: 0.93 mg/dL (ref 0.70–1.33)
GLOBULIN: 2.7 g/dL (ref 1.9–3.7)
Glucose, Bld: 140 mg/dL — ABNORMAL HIGH (ref 65–99)
POTASSIUM: 3.8 mmol/L (ref 3.5–5.3)
SODIUM: 138 mmol/L (ref 135–146)
Total Protein: 7.1 g/dL (ref 6.1–8.1)

## 2018-10-10 LAB — HEPATITIS C RNA QUANTITATIVE
HCV QUANT LOG: NOT DETECTED {Log_IU}/mL
HCV RNA, PCR, QN: NOT DETECTED [IU]/mL

## 2018-11-07 ENCOUNTER — Ambulatory Visit (INDEPENDENT_AMBULATORY_CARE_PROVIDER_SITE_OTHER): Payer: BLUE CROSS/BLUE SHIELD | Admitting: Pharmacist

## 2018-11-07 DIAGNOSIS — B182 Chronic viral hepatitis C: Secondary | ICD-10-CM | POA: Diagnosis not present

## 2018-11-07 NOTE — Progress Notes (Signed)
HPI: Patrick Wood is a 57 y.o. male who presents to the [facility] pharmacy clinic for follow-up of his Hepatitis C infection.  Medication: Mavyret x 8 weeks  Start Date: 09/12/18  Hepatitis C Genotype: 1a  Fibrosis Score: F2/F3  Hepatitis C RNA: 802,000 on 10/1 and <15 on 11/19  Patient Active Problem List   Diagnosis Date Noted  . Chronic hepatitis C without hepatic coma (HCC) 08/28/2018  . 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 Stroke-like symptoms 06/15/2018  . Gastroesophageal reflux disease without esophagitis 07/03/2017  . Dysphagia 07/03/2017  . Family history of prostate cancer 07/03/2017  . Hematuria 07/03/2017    Patient's Medications  New Prescriptions   No medications on file  Previous Medications   LOSARTAN (COZAAR) 25 MG TABLET    Take 1 tablet (25 mg total) by mouth daily.   PANTOPRAZOLE (PROTONIX) 40 MG TABLET    Take 1 tablet (40 mg total) by mouth daily.   VITAMIN B-12 (CYANOCOBALAMIN) 1000 MCG TABLET    Take 1,000 mcg by mouth daily.  Modified Medications   No medications on file  Discontinued Medications   GLECAPREVIR-PIBRENTASVIR (MAVYRET) 100-40 MG TABS    Take 3 tablets by mouth daily with breakfast.    Allergies: No Known Allergies  Past Medical History: Past Medical History:  Diagnosis Date  . GERD (gastroesophageal reflux disease)   . Guillain Barr syndrome [facility](HCC)     Social History: Social History   Socioeconomic History  . Marital status: Married    Spouse name: Not on file  . Number of children: 3  . Years of education: 1712  . Highest education level: High school graduate  Occupational History  . Occupation: self employed - land lord  Social Needs  . Financial resource strain: Not on file  . Food insecurity:    Worry: Not on file    Inability: Not on file  . Transportation needs:    Medical: Not on file    Non-medical: Not on file  Tobacco Use  . Smoking status: Former Smoker    Last  attempt to quit: 1995    Years since quitting: 24.9  . Smokeless tobacco: Never Used  Substance and Sexual Activity  . Alcohol use: Not Currently  . Drug use: Never  . Sexual activity: Yes  Lifestyle  . Physical activity:    Days per week: Not on file    Minutes per session: Not on file  . Stress: Not on file  Relationships  . Social connections:    Talks on phone: Not on file    Gets together: Not on file    Attends religious service: Not on file    Active member of club or organization: Not on file    Attends meetings of clubs or organizations: Not on file    Relationship status: Not on file  Other Topics Concern  . Not on file  Social History Narrative   Lives at home with his wife.   Right-handed.   2 cups caffeine per day.    Labs: Hepatitis C Lab Results  Component Value Date   HCVGENOTYPE 1a 08/28/2018   HEPCAB REACTIVE (A) 08/20/2018   HCVRNAPCRQN <15 NOT DETECTED 10/08/2018   HCVRNAPCRQN 802,000 (H) 08/20/2018   Hepatitis B Lab Results  Component Value Date   HEPBSAB REACTIVE (A) 08/28/2018   HEPBSAG NON-REACTIVE 08/20/2018   Hepatitis A No results found for: HAV HIV Lab Results  Component Value Date   HIV  Non Reactive 06/15/2018   Lab Results  Component Value Date   CREATININE 0.93 10/08/2018   CREATININE 0.94 08/19/2018   CREATININE 1.00 06/15/2018   CREATININE 0.97 06/15/2018   CREATININE 0.93 07/03/2017   Lab Results  Component Value Date   AST 15 10/08/2018   AST 50 (H) 08/19/2018   AST 38 06/15/2018   ALT 11 10/08/2018   ALT 161 (H) 08/19/2018   ALT 73 (H) 06/15/2018   INR 1.03 06/15/2018    Assessment: Patrick Postlex is here today to follow-up for his Hep C infection. He finished 8 weeks of Mavyret last night with no issues and no side effects.  He did not miss any doses.  His early on treatment Hep C viral load was already undetectable. Will check a viral load today and see him back in 3 months for his cure visit.   Plan: - Completed  Mavyret x 8 weeks - Hep C viral load - F/u with me 3/24 at 915am   L. , PharmD, BCIDP, AAHIVP, CPP Infectious Diseases Clinical Pharmacist [facility] 11/07/2018, 2:14 PM

## 2018-11-12 LAB — HEPATITIS C RNA QUANTITATIVE
HCV Quantitative Log: <1.18 Log IU/mL
HCV RNA, PCR, QN: NOT DETECTED [IU]/mL

## 2018-11-15 ENCOUNTER — Telehealth: Payer: Self-pay

## 2018-11-15 NOTE — Telephone Encounter (Signed)
Incoming fax request for refills on Protonix 40 mg one a day. 30 days supply given only and advised to schedule a follow up office visit for any further refills.

## 2019-02-11 ENCOUNTER — Other Ambulatory Visit: Payer: Self-pay

## 2019-02-11 ENCOUNTER — Ambulatory Visit: Payer: BLUE CROSS/BLUE SHIELD | Admitting: Physician Assistant

## 2019-02-11 ENCOUNTER — Ambulatory Visit (INDEPENDENT_AMBULATORY_CARE_PROVIDER_SITE_OTHER): Payer: BLUE CROSS/BLUE SHIELD | Admitting: Pharmacist

## 2019-02-11 DIAGNOSIS — B182 Chronic viral hepatitis C: Secondary | ICD-10-CM

## 2019-02-11 NOTE — Progress Notes (Signed)
HPI: Patrick Wood is a 58 y.o. male who presents to the [facility] pharmacy clinic for Hepatitis C follow-up.  Medication: Mavyret x 8 weeks  Start Date: 09/12/18  Hepatitis C Genotype: 1a  Fibrosis Score: F2/F3  Hepatitis C RNA: 802,000 on 10/1 and <15 on 10/08/18 and 11/07/18  Patient Active Problem List   Diagnosis Date Noted  . Chronic hepatitis C without hepatic coma (HCC) 08/28/2018  . 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 Stroke-like symptoms 06/15/2018  . Gastroesophageal reflux disease without esophagitis 07/03/2017  . Dysphagia 07/03/2017  . Family history of prostate cancer 07/03/2017  . Hematuria 07/03/2017    Patient's Medications  New Prescriptions   No medications on file  Previous Medications   LOSARTAN (COZAAR) 25 MG TABLET    Take 1 tablet (25 mg total) by mouth daily.   PANTOPRAZOLE (PROTONIX) 40 MG TABLET    Take 1 tablet (40 mg total) by mouth daily.   VITAMIN B-12 (CYANOCOBALAMIN) 1000 MCG TABLET    Take 1,000 mcg by mouth daily.  Modified Medications   No medications on file  Discontinued Medications   No medications on file    Allergies: No Known Allergies  Past Medical History: Past Medical History:  Diagnosis Date  . GERD (gastroesophageal reflux disease)   . Guillain Barr syndrome [facility])     Social History: Social History   Socioeconomic History  . Marital status: Married    Spouse name: Not on file  . Number of children: 3  . Years of education: 43  . Highest education level: High school graduate  Occupational History  . Occupation: self employed - land lord  Social Needs  . Financial resource strain: Not on file  . Food insecurity:    Worry: Not on file    Inability: Not on file  . Transportation needs:    Medical: Not on file    Non-medical: Not on file  Tobacco Use  . Smoking status: Former Smoker    Last attempt to quit: 1995    Years since quitting: 25.2  . Smokeless tobacco:  Never Used  Substance and Sexual Activity  . Alcohol use: Not Currently  . Drug use: Never  . Sexual activity: Yes  Lifestyle  . Physical activity:    Days per week: Not on file    Minutes per session: Not on file  . Stress: Not on file  Relationships  . Social connections:    Talks on phone: Not on file    Gets together: Not on file    Attends religious service: Not on file    Active member of club or organization: Not on file    Attends meetings of clubs or organizations: Not on file    Relationship status: Not on file  Other Topics Concern  . Not on file  Social History Narrative   Lives at home with his wife.   Right-handed.   2 cups caffeine per day.    Labs: Hepatitis C Lab Results  Component Value Date   HCVGENOTYPE 1a 08/28/2018   HEPCAB REACTIVE (A) 08/20/2018   HCVRNAPCRQN <15 NOT DETECTED 11/07/2018   HCVRNAPCRQN <15 NOT DETECTED 10/08/2018   HCVRNAPCRQN 802,000 (H) 08/20/2018   Hepatitis B Lab Results  Component Value Date   HEPBSAB REACTIVE (A) 08/28/2018   HEPBSAG NON-REACTIVE 08/20/2018   Hepatitis A No results found for: HAV HIV Lab Results  Component Value Date   HIV Non Reactive 06/15/2018   Lab  Results  Component Value Date   CREATININE 0.93 10/08/2018   CREATININE 0.94 08/19/2018   CREATININE 1.00 06/15/2018   CREATININE 0.97 06/15/2018   CREATININE 0.93 07/03/2017   Lab Results  Component Value Date   AST 15 10/08/2018   AST 50 (H) 08/19/2018   AST 38 06/15/2018   ALT 11 10/08/2018   ALT 161 (H) 08/19/2018   ALT 73 (H) 06/15/2018   INR 1.03 06/15/2018    Assessment: Patrick Wood is here today for his Hepatitis C SVR12 cure visit.  He finished 8 weeks of Mavyret back in December with no issues or missed doses.  His early on treatment and end of treatment Hep C viral loads were both undetectable.  Will check a final viral load today for cure.   Plan: - Hep C RNA for cure  Cassie L. Kuppelweiser, PharmD, BCIDP, AAHIVP, CPP  Infectious Diseases Clinical Pharmacist [facility] 02/11/2019, 2:55 PM

## 2019-02-13 ENCOUNTER — Other Ambulatory Visit: Payer: Self-pay

## 2019-02-13 MED ORDER — PANTOPRAZOLE SODIUM 40 MG PO TBEC: 40.0000 mg | DELAYED_RELEASE_TABLET | Freq: Every day | ORAL | 0 refills | Status: DC

## 2019-02-25 LAB — HEPATITIS C RNA QUANTITATIVE
HCV QUANT LOG: NOT DETECTED {Log_IU}/mL
HCV RNA, PCR, QN: NOT DETECTED [IU]/mL

## 2019-05-13 ENCOUNTER — Other Ambulatory Visit: Payer: Self-pay

## 2019-05-13 NOTE — Telephone Encounter (Signed)
This patient has not been seen in our clinic since Sept 2018. Med refilled Dec 2019 with understanding that follow up appointment would be made.  Needs to re-establish care with next available telemedicine visit if requesting prescription.

## 2019-05-19 DIAGNOSIS — M79671 Pain in right foot: Secondary | ICD-10-CM | POA: Diagnosis not present

## 2019-05-19 DIAGNOSIS — M79672 Pain in left foot: Secondary | ICD-10-CM | POA: Diagnosis not present

## 2019-05-19 DIAGNOSIS — M722 Plantar fascial fibromatosis: Secondary | ICD-10-CM | POA: Diagnosis not present

## 2019-05-21 ENCOUNTER — Telehealth: Payer: Self-pay | Admitting: Gastroenterology

## 2019-05-21 ENCOUNTER — Other Ambulatory Visit: Payer: Self-pay

## 2019-05-21 NOTE — Telephone Encounter (Signed)
Left a detailed phone message to schedule a follow pu for refills. Last seen 2018. In Dec 2019. Was given a 30 days supply and instructed to schedule a follow up at that time .

## 2019-06-05 ENCOUNTER — Telehealth: Payer: Self-pay | Admitting: Gastroenterology

## 2019-06-05 NOTE — Telephone Encounter (Signed)
Covid-19 screening questions   Do you now or have you had a fever in the last 14 days?  Do you have any respiratory symptoms of shortness of breath or cough now or in the last 14 days?  Do you have any family members or close contacts with diagnosed or suspected Covid-19 in the past 14 days?  Have you been tested for Covid-19 and found to be positive?       

## 2019-06-06 ENCOUNTER — Ambulatory Visit: Payer: BC Managed Care – PPO | Admitting: Nurse Practitioner

## 2019-06-06 ENCOUNTER — Encounter: Payer: Self-pay | Admitting: Nurse Practitioner

## 2019-06-06 ENCOUNTER — Other Ambulatory Visit: Payer: Self-pay

## 2019-06-06 VITALS — BP 126/84 | HR 84 | Temp 98.2°F | Ht 69.0 in | Wt 217.2 lb

## 2019-06-06 DIAGNOSIS — K219 Gastro-esophageal reflux disease without esophagitis: Secondary | ICD-10-CM

## 2019-06-06 MED ORDER — PANTOPRAZOLE SODIUM 40 MG PO TBEC: 40.0000 mg | DELAYED_RELEASE_TABLET | Freq: Every day | ORAL | 11 refills | Status: DC

## 2019-06-06 NOTE — Progress Notes (Signed)
       Chief Complaint:    GERD follow  IMPRESSION and PLAN:    97.  58 year old male with longstanding GERD manifested as regurgitation and heartburn.  He is asymptomatic on daily Protonix, requesting refill -Protonix 40 mg #30 with refills -We discussed EGD for screen for Barrett's esophagus, patient declined again.    2.  Colon cancer screening.    No bowel changes, blood in stool or other signs/ symptoms of colon cancer. No Raemon of colon cancer -He is not interested in colonoscopy nor any type of colon cancer screening  HPI:     Patient is a 58 year old male seen by Dr. Loletha Carrow September 2018 with for evaluation of longstanding GERD.  He was doing better on PPI but we discussed upper endoscopy for Barrett's esophagus screening given years of uncontrolled symptoms and complaints of dysphagia. Patient declined the EGD. We offered colonoscopy for colon cancer screening, he declined to have it.   Patrick Wood comes in today for a refill on Protonix.  He is asymptomatic on once daily Protonix but he forgets to take it he has recurrent nocturnal symptoms consisting of reflux and heartburn.  He has elevated the head of his bed.  No dysphagia, no weight loss.  No bowel changes, no blood in stool.    Review of systems:     No chest pain, no SOB, no fevers, no urinary sx   Past Medical History:  Diagnosis Date  . GERD (gastroesophageal reflux disease)   . Guillain Barr syndrome ([place])   . Plantar fasciitis     Patient's surgical history, family medical history, social history, medications and allergies were all reviewed in Epic    Current Outpatient Medications  Medication Sig Dispense Refill  . meloxicam (MOBIC) 15 MG tablet Take 1 tablet by mouth daily.    . pantoprazole (PROTONIX) 40 MG tablet Take 1 tablet (40 mg total) by mouth daily. (Patient not taking: Reported on 06/06/2019) 90 tablet 0   No current facility-administered medications for this visit.     Physical Exam:     BP  126/84 (BP Location: Left Arm, Patient Position: Sitting, Cuff Size: Normal)   Pulse 84   Temp 98.2 F (36.8 C)   Ht 5\' 9"  (1.753 m)   Wt 217 lb 4 oz (98.5 kg)   BMI 32.08 kg/m   GENERAL:  Well developed white male in NAD PSYCH: : Cooperative, normal affect EENT:  conjunctiva pink, mucous membranes moist, neck supple without masses CARDIAC:  RRR, no murmur heard, no peripheral edema PULM: Normal respiratory effort, lungs CTA bilaterally, no wheezing ABDOMEN:  Nondistended, soft, nontender. No obvious masses, no hepatomegaly,  normal bowel sounds. Small umbilical hernia SKIN:  turgor, no lesions seen Musculoskeletal:  Normal muscle tone, normal strength NEURO: Alert and oriented x 3, no focal neurologic deficits   Tye Savoy , NP 06/06/2019, 3:27 PM

## 2019-06-06 NOTE — Telephone Encounter (Signed)
Pt came in for appt and was asked questions by front staff.

## 2019-06-06 NOTE — Progress Notes (Signed)
____________________________________________________________  Attending physician addendum:  Thank you for sending this case to me. I have reviewed the entire note, and the outlined plan seems appropriate.  If she should change his mind about the endoscopic procedures, I would be agreeable.  Wilfrid Lund, MD  ____________________________________________________________

## 2019-06-06 NOTE — Patient Instructions (Signed)
We have sent the following medications to your pharmacy for you to pick up at your convenience: Protonix  Thank you for choosing me and [facility].  [place]

## 2019-06-09 ENCOUNTER — Other Ambulatory Visit: Payer: Self-pay

## 2019-06-09 MED ORDER — PANTOPRAZOLE SODIUM 40 MG PO TBEC: 40.0000 mg | DELAYED_RELEASE_TABLET | Freq: Every day | ORAL | 11 refills | Status: DC

## 2019-07-02 DIAGNOSIS — M722 Plantar fascial fibromatosis: Secondary | ICD-10-CM | POA: Diagnosis not present

## 2020-03-23 IMAGING — US US ABDOMEN COMPLETE W/ ELASTOGRAPHY
1 series · 12 of 25 positions shown · non-contrast
Comparison: CT abdomen pelvis 09/03/2018

CLINICAL DATA: Chronic hepatitis-C without hepatic coma, history
GERD, former smoker, Guillain-Barre syndrome



[Series 1: us abdomen complete w/ elastography · 0.23mm/px · 12 of 101 slices shown]
[im 5/101]
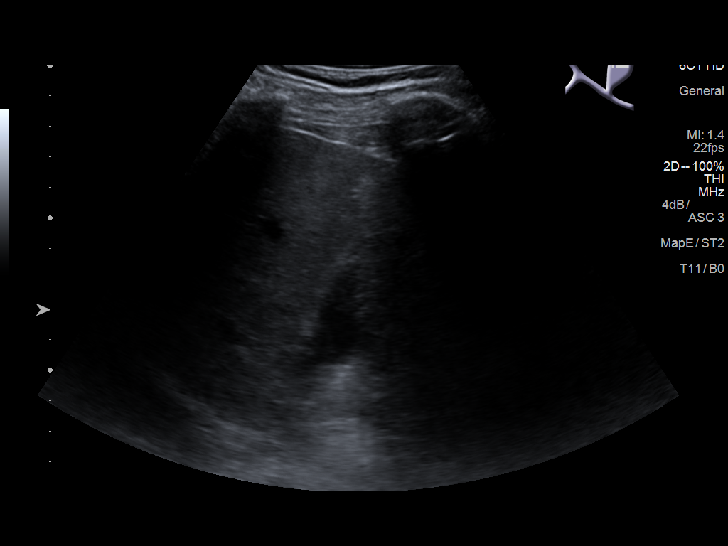
[im 13/101]
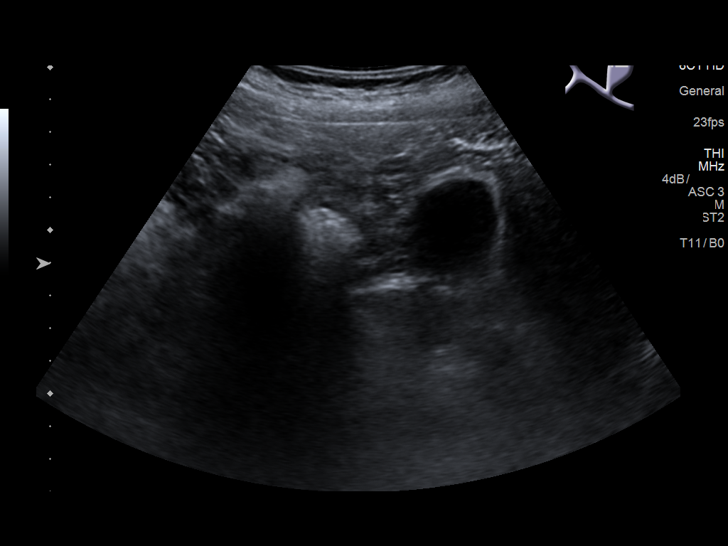
[im 21/101]
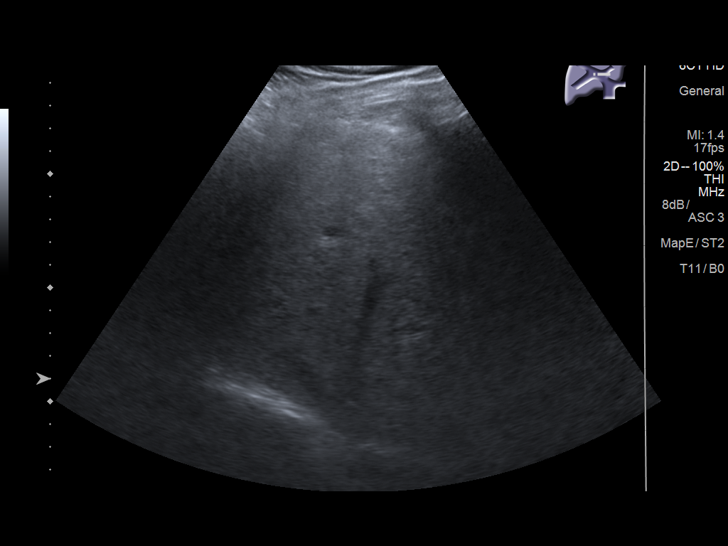
[im 30/101]
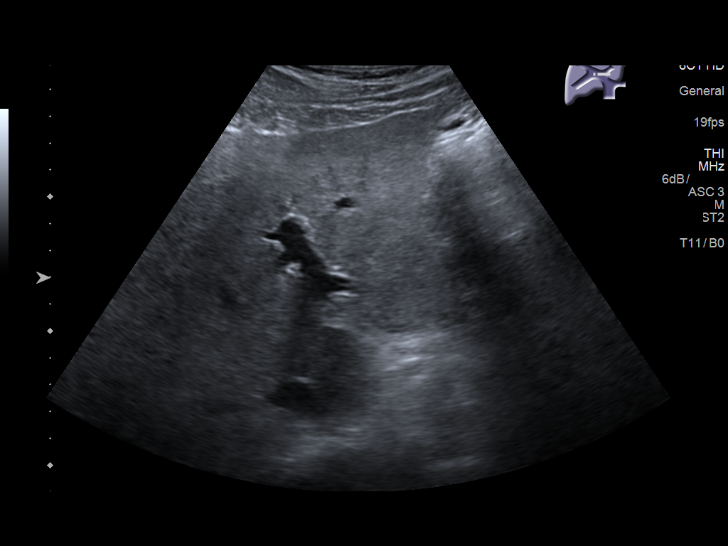
[im 38/101]
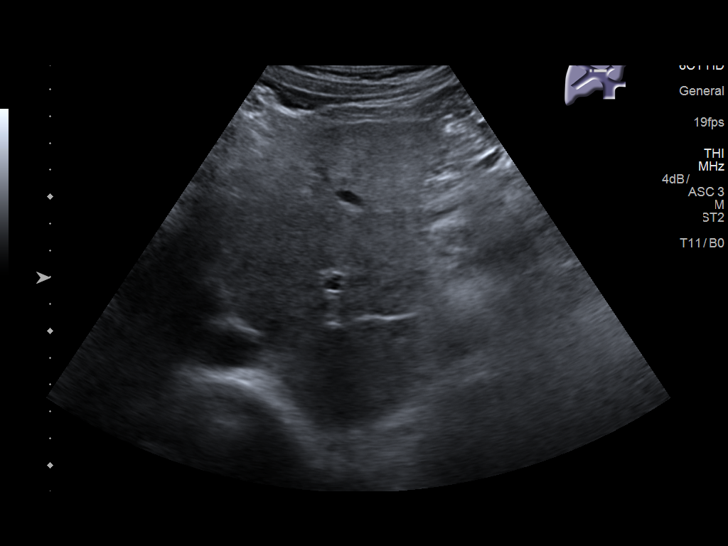
[im 46/101]
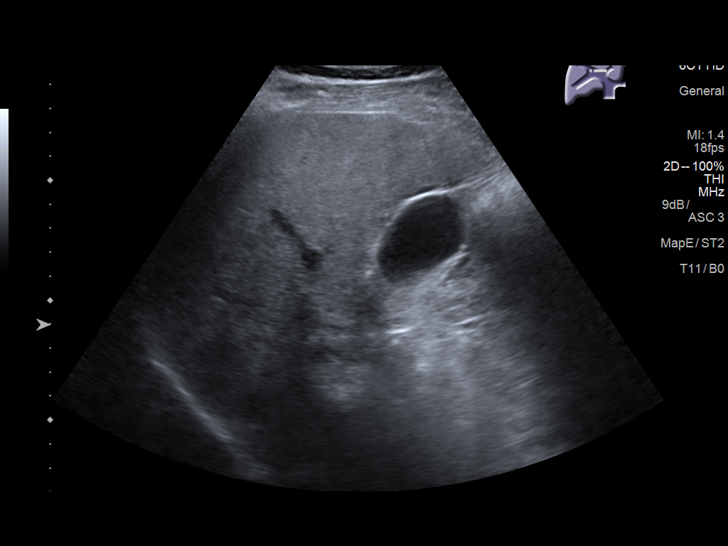
[im 55/101]
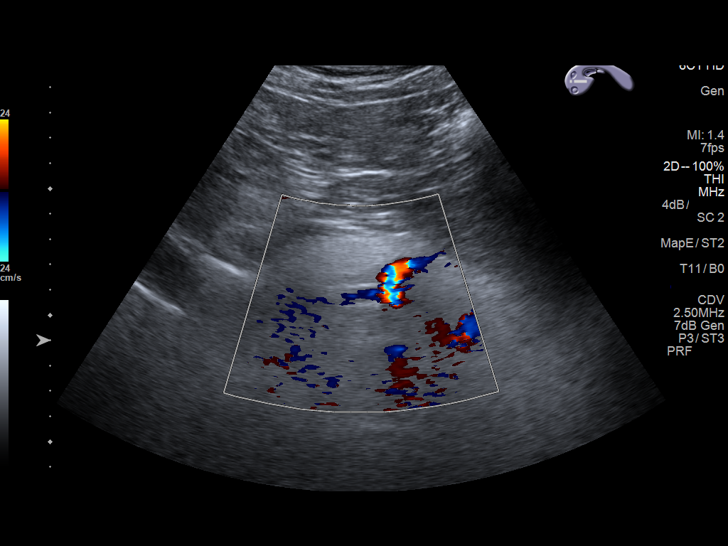
[im 63/101]
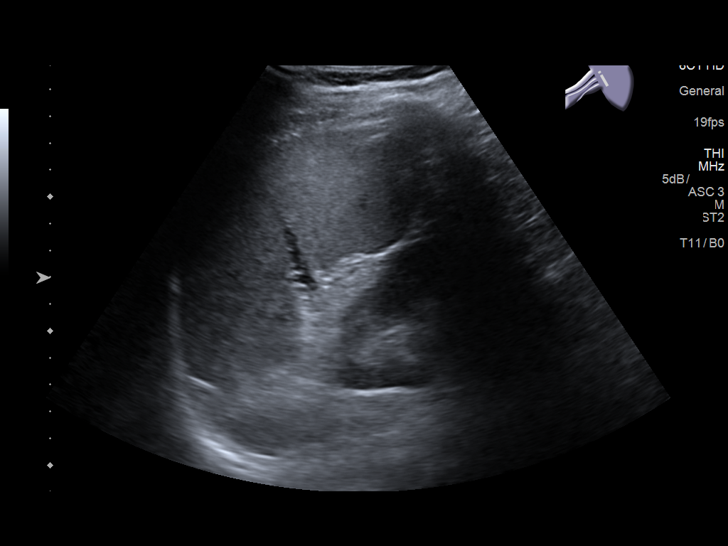
[im 71/101]
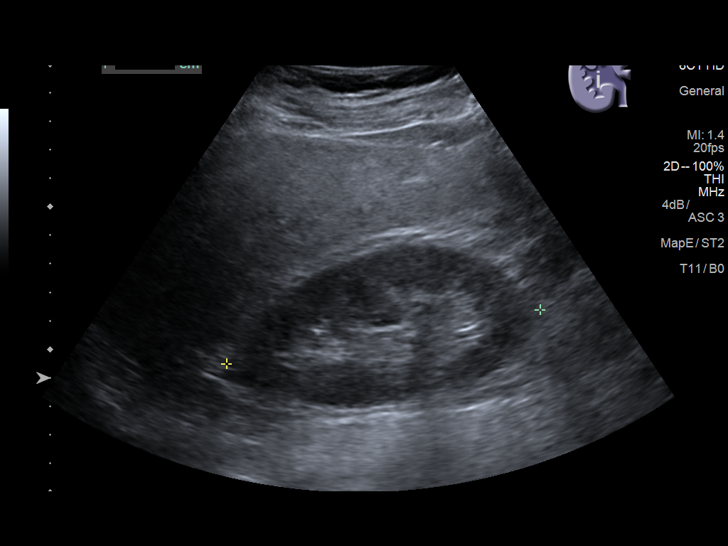
[im 80/101]
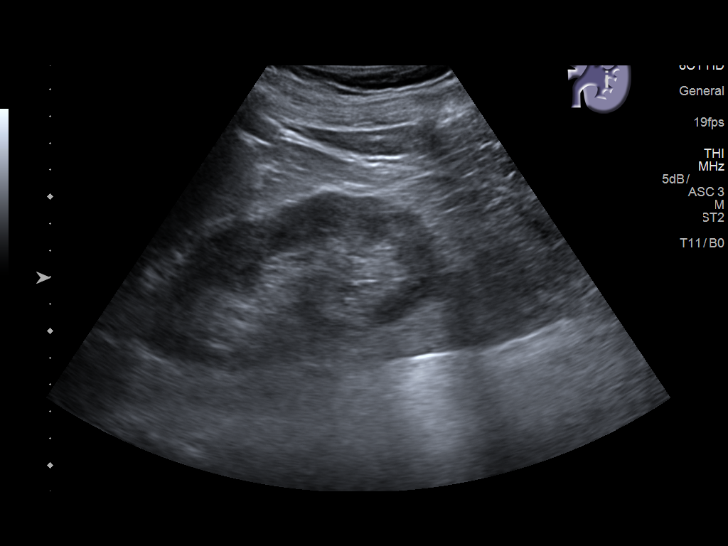
[im 88/101]
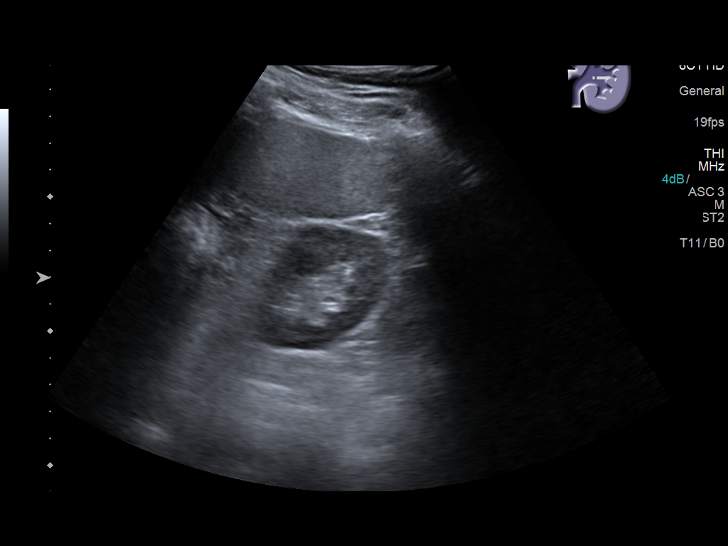
[im 96/101]
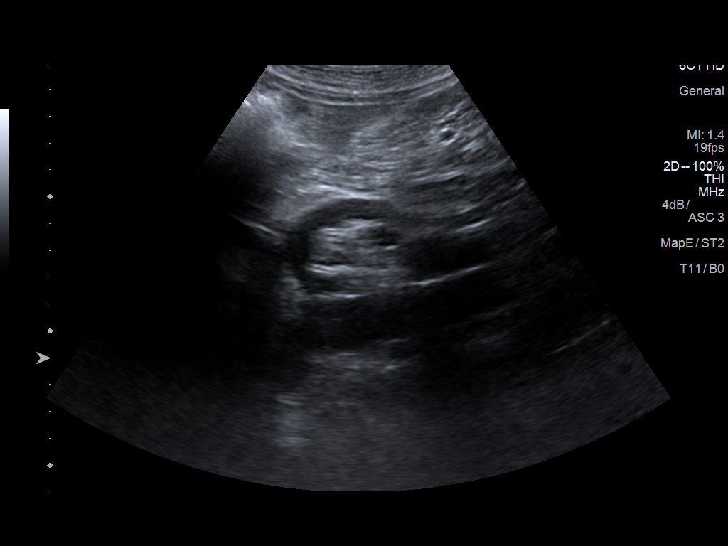

[12 of 25 positions shown; findings below may reference images not displayed]

FINDINGS: ULTRASOUND ABDOMEN

Gallbladder: Normally distended without stones or wall thickening.
No pericholecystic fluid or sonographic Murphy sign.

Common bile duct: Diameter: 3 mm, normal

Liver: Echogenic parenchyma, question fatty infiltration though this
can be seen with cirrhosis and certain infiltrative disorders. No
definite hepatic mass or nodularity portal vein is patent on color
Doppler imaging with normal direction of blood flow towards the
liver.

IVC: Normal appearance

Pancreas: Suboptimally visualized due to bowel gas, with portions
obscured and visualized portions grossly unremarkable.

Spleen: Normal appearance, 9.8 cm length

Right Kidney: Length: 11.2 cm. Normal morphology without mass or
hydronephrosis.

Left Kidney: Length: 11.9 cm. Normal morphology without mass or
hydronephrosis.

Abdominal aorta: Normal caliber

Other findings: No free fluid

ULTRASOUND HEPATIC ELASTOGRAPHY

Device: Siemens Helix VTQ

Patient position: Left Lateral Decubitus

Transducer [id number]

Number of measurements: 10

Hepatic segment:  8

Median velocity:   1.54 m/sec

IQR:

IQR/Median velocity ratio:

Corresponding Metavir fibrosis score:  F2 + some F3

Risk of fibrosis: Moderate

Limitations of exam: None

Please note that abnormal shear wave velocities may also be
identified in clinical settings other than with hepatic fibrosis,
such as: acute hepatitis, elevated right heart and central venous
pressures including use of beta blockers, [place] disease
(Der), infiltrative processes such as
mastocytosis/amyloidosis/infiltrative tumor, extrahepatic
cholestasis, in the post-prandial state, and liver transplantation.
Correlation with patient history, laboratory data, and clinical
condition recommended.
IMPRESSION: ULTRASOUND ABDOMEN:

Increased hepatic parenchymal echogenicity which can be seen with
fatty infiltration, cirrhosis and certain infiltrative disorders.

No definite hepatic mass or nodularity.

Incomplete pancreatic visualization.

ULTRASOUND HEPATIC ELASTOGRAPHY:

Median hepatic shear wave velocity is calculated at 1.54 m/sec.

Corresponding Metavir fibrosis score is F2 + some F3.

Risk of fibrosis is Moderate.

Follow-up: Additional testing appropriate

## 2020-06-23 ENCOUNTER — Other Ambulatory Visit: Payer: Self-pay | Admitting: Nurse Practitioner

## 2020-09-18 ENCOUNTER — Other Ambulatory Visit: Payer: Self-pay | Admitting: Nurse Practitioner

## 2020-10-01 ENCOUNTER — Encounter: Payer: Self-pay | Admitting: Nurse Practitioner

## 2020-10-01 ENCOUNTER — Ambulatory Visit (INDEPENDENT_AMBULATORY_CARE_PROVIDER_SITE_OTHER): Payer: 59 | Admitting: Nurse Practitioner

## 2020-10-01 VITALS — BP 142/90 | HR 80 | Wt 222.8 lb

## 2020-10-01 DIAGNOSIS — K219 Gastro-esophageal reflux disease without esophagitis: Secondary | ICD-10-CM | POA: Diagnosis not present

## 2020-10-01 MED ORDER — PANTOPRAZOLE SODIUM 40 MG PO TBEC
DELAYED_RELEASE_TABLET | ORAL | 11 refills | Status: DC
Start: 2020-10-01 — End: 2020-12-20

## 2020-10-01 NOTE — Patient Instructions (Signed)
If you are age 59 or older, your body mass index should be between 23-30. Your Body mass index is 32.9 kg/m. If this is out of the aforementioned range listed, please consider follow up with your Primary Care Provider.  If you are age 54 or younger, your body mass index should be between 19-25. Your Body mass index is 32.9 kg/m. If this is out of the aformentioned range listed, please consider follow up with your Primary Care Provider.   We have sent the following medications to your pharmacy for you to pick up at your convenience: Pantoprazole 40 mg daily.   Colonoscopy, Adult A colonoscopy is a procedure to look at the entire large intestine. This procedure is done using a long, thin, flexible tube that has a camera on the end. You may have a colonoscopy:  As a part of normal colorectal screening.  If you have certain symptoms, such as: ? A low number of red blood cells in your blood (anemia). ? Diarrhea that does not go away. ? Pain in your abdomen. ? Blood in your stool. A colonoscopy can help screen for and diagnose medical problems, including:  Tumors.  Extra tissue that grows where mucus forms (polyps).  Inflammation.  Areas of bleeding. Tell your health care provider about:  Any allergies you have.  All medicines you are taking, including vitamins, herbs, eye drops, creams, and over-the-counter medicines.  Any problems you or family members have had with anesthetic medicines.  Any blood disorders you have.  Any surgeries you have had.  Any medical conditions you have.  Any problems you have had with having bowel movements.  Whether you are pregnant or may be pregnant. What are the risks? Generally, this is a safe procedure. However, problems may occur, including:  Bleeding.  Damage to your intestine.  Allergic reactions to medicines given during the procedure.  Infection. This is rare. What happens before the procedure? Eating and drinking  restrictions Follow instructions from your health care provider about eating or drinking restrictions, which may include:  A few days before the procedure: ? Follow a low-fiber diet. ? Avoid nuts, seeds, dried fruit, raw fruits, and vegetables.  1-3 days before the procedure: ? Eat only gelatin dessert or ice pops. ? Drink only clear liquids, such as water, clear juice, clear broth or bouillon, black coffee or tea, or clear soft drinks or sports drinks. ? Avoid liquids that contain red or purple dye.  The day of the procedure: ? Do not eat solid foods. You may continue to drink clear liquids until up to 2 hours before the procedure. ? Do not eat or drink anything starting 2 hours before the procedure, or within the time period that your health care provider recommends. Bowel prep If you were prescribed a bowel prep to take by mouth (orally) to clean out your colon:  Take it as told by your health care provider. Starting the day before your procedure, you will need to drink a large amount of liquid medicine. The liquid will cause you to have many bowel movements of loose stool until your stool becomes almost clear or light green.  If your skin or the opening between the buttocks (anus) gets irritated from diarrhea, you may relieve the irritation using: ? Wipes with medicine in them, such as adult wet wipes with aloe and vitamin E. ? A product to soothe skin, such as petroleum jelly.  If you vomit while drinking the bowel prep: ? Take a break  for up to 60 minutes. ? Begin the bowel prep again. ? Call your health care provider if you keep vomiting or you cannot take the bowel prep without vomiting.  To clean out your colon, you may also be given: ? Laxative medicines. These help you have a bowel movement. ? Instructions for enema use. An enema is liquid medicine injected into your rectum. Medicines Ask your health care provider about:  Changing or stopping your regular medicines or  supplements. This is especially important if you are taking iron supplements, diabetes medicines, or blood thinners.  Taking medicines such as aspirin and ibuprofen. These medicines can thin your blood. Do not take these medicines unless your health care provider tells you to take them.  Taking over-the-counter medicines, vitamins, herbs, and supplements. General instructions  Ask your health care provider what steps will be taken to help prevent infection. These may include washing skin with a germ-killing soap.  Plan to have someone take you home from the hospital or clinic. What happens during the procedure?   An IV will be inserted into one of your veins.  You may be given one or more of the following: ? A medicine to help you relax (sedative). ? A medicine to numb the area (local anesthetic). ? A medicine to make you fall asleep (general anesthetic). This is rarely needed.  You will lie on your side with your knees bent.  The tube will: ? Have oil or gel put on it (be lubricated). ? Be inserted into your anus. ? Be gently eased through all parts of your large intestine.  Air will be sent into your colon to keep it open. This may cause some pressure or cramping.  Images will be taken with the camera and will appear on a screen.  A small tissue sample may be removed to be looked at under a microscope (biopsy). The tissue may be sent to a lab for testing if any signs of problems are found.  If small polyps are found, they may be removed and checked for cancer cells.  When the procedure is finished, the tube will be removed. The procedure may vary among health care providers and hospitals. What happens after the procedure?  Your blood pressure, heart rate, breathing rate, and blood oxygen level will be monitored until you leave the hospital or clinic.  You may have a small amount of blood in your stool.  You may pass gas and have mild cramping or bloating in your abdomen.  This is caused by the air that was used to open your colon during the exam.  Do not drive for 24 hours after the procedure.  It is up to you to get the results of your procedure. Ask your health care provider, or the department that is doing the procedure, when your results will be ready. Summary  A colonoscopy is a procedure to look at the entire large intestine.  Follow instructions from your health care provider about eating and drinking before the procedure.  If you were prescribed an oral bowel prep to clean out your colon, take it as told by your health care provider.  During the colonoscopy, a flexible tube with a camera on its end is inserted into the anus and then passed into the other parts of the large intestine. This information is not intended to replace advice given to you by your health care provider. Make sure you discuss any questions you have with your health care provider. Document Revised:  05/30/2019 Document Reviewed: 05/30/2019 Elsevier Patient Education  2020 [employer].

## 2020-10-01 NOTE — Progress Notes (Signed)
     ASSESSMENT AND PLAN    # GERD, symptoms controlled on daily Pantoprazole. Symptomatic if forgets to take a dose.  --anti-reflux measures discussed --Refill Protonix 40 mg # 30 with 11 refills --Patient still not interested in EGD for Barrett's screening.   # History of HCV, treated.  Negative HCVRNA by PCR March 2020.  No evidence of cirrhosis on previous imaging  # Colon cancer screening. I brought this up again today. He is not interested in scheduling a colonoscopy right now but will at least think about it. I reminded him that colon cancer is a leading cause of cancer deaths in the country and in most cases it is preventable through screening. I gave him a brochure on colon polyps / cancer.    HISTORY OF PRESENT ILLNESS     Primary Gastroenterologist : Amada Jupiter, MD  Chief Complaint : medication refill  Patrick Wood is a 59 y.o. male with PMH / PSH significant for,  but not necessarily limited to: GERD, diverticulosis, prostamegaly  Patient has longstanding history of GERD with regurgitation and heartburn.  He has been reluctant to undergo EGD for Barrett's esophagus screening despite years of uncontrolled symptoms prior to starting Protonix.  Additionally, he has not been interested in colonoscopy or any type of colon cancer screening  Patrick Wood's acid reflux is under control with daily protonix. He recently forgot to take it and got regurgitation / burning. He tries to go to bed on an empty stomach. He consumes only two caffeine beverages a day. No dysphagia. Bowel movement normal. No blood in stool.   Past Medical History:  Diagnosis Date  . GERD (gastroesophageal reflux disease)   . Guillain Barr syndrome (HCC)   . Plantar fasciitis     Current Medications, Allergies, Past Surgical History, Family History and Social History were reviewed in [employer] record.   Current Outpatient Medications  Medication Sig Dispense Refill  . meloxicam  (MOBIC) 15 MG tablet Take 1 tablet by mouth daily.    . pantoprazole (PROTONIX) 40 MG tablet TAKE 1 TABLET BY MOUTH EVERY DAY. FOLLOW UP FOR FURTHER REFILLS 30 tablet 2   No current facility-administered medications for this visit.    Review of Systems: No chest pain. No shortness of breath. No urinary complaints.   PHYSICAL EXAM :    Wt Readings from Last 3 Encounters:  06/06/19 217 lb 4 oz (98.5 kg)  08/28/18 206 lb (93.4 kg)  08/19/18 207 lb (93.9 kg)    There were no vitals taken for this visit. Constitutional:  Pleasant male in no acute distress. Psychiatric: Normal mood and affect. Behavior is normal. EENT: Pupils normal.  Conjunctivae are normal. No scleral icterus. Neck supple.  Cardiovascular: Normal rate, regular rhythm. No edema Pulmonary/chest: Effort normal and breath sounds normal. No wheezing, rales or rhonchi. Abdominal: Soft, nondistended, nontender. Bowel sounds active throughout. There are no masses palpable. No hepatomegaly. Neurological: Alert and oriented to person place and time. Skin: Skin is warm and dry. No rashes noted.  Willette Cluster, NP  10/01/2020, 1:29 PM  Cc:   Waldon Merl, PA-C

## 2020-10-04 NOTE — Progress Notes (Signed)
____________________________________________________________  Attending physician addendum:  Thank you for sending this case to me. I have reviewed the entire note, and the outlined plan seems appropriate.  Caleyah Jr Danis, MD  ____________________________________________________________  

## 2020-12-20 ENCOUNTER — Other Ambulatory Visit: Payer: Self-pay | Admitting: Nurse Practitioner

## 2021-03-07 ENCOUNTER — Telehealth: Payer: Self-pay

## 2021-03-07 NOTE — Telephone Encounter (Signed)
LVM about getting scheduled for a TOC. 

## 2021-10-24 ENCOUNTER — Encounter: Payer: Self-pay | Admitting: Nurse Practitioner

## 2021-10-24 ENCOUNTER — Ambulatory Visit: Payer: 59 | Admitting: Nurse Practitioner

## 2021-10-24 VITALS — BP 138/90 | HR 80 | Ht 69.0 in | Wt 219.5 lb

## 2021-10-24 DIAGNOSIS — K219 Gastro-esophageal reflux disease without esophagitis: Secondary | ICD-10-CM | POA: Diagnosis not present

## 2021-10-24 MED ORDER — PANTOPRAZOLE SODIUM 40 MG PO TBEC
40.0000 mg | DELAYED_RELEASE_TABLET | Freq: Every day | ORAL | 6 refills | Status: DC
Start: 2021-10-24 — End: 2022-07-14

## 2021-10-24 NOTE — Patient Instructions (Signed)
We have sent the following medications to your pharmacy for you to pick up at your convenience: Pantoprazole 40 mg daily 30-60 minutes before dinner.   If you are age 60 or older, your body mass index should be between 23-30. Your Body mass index is 32.41 kg/m. If this is out of the aforementioned range listed, please consider follow up with your Primary Care Provider.  If you are age 61 or younger, your body mass index should be between 19-25. Your Body mass index is 32.41 kg/m. If this is out of the aformentioned range listed, please consider follow up with your Primary Care Provider.   ________________________________________________________  The [facility] providers would like to encourage you to use [facility] to communicate with providers for non-urgent requests or questions.  Due to long hold times on the telephone, sending your provider a message by [facility] may be a faster and more efficient way to get a response.  Please allow 48 business hours for a response.  Please remember that this is for non-urgent requests.  _______________________________________________________

## 2021-10-24 NOTE — Progress Notes (Signed)
ASSESSMENT AND PLAN    # GERD. Symptoms overall well controlled on 40 mg of Pantoprazole daily . Occasional nocturnal regurgitation if he overeats / eats late.  --Will refill Pantoprazole 40 mg , # 30 with 6 refills --He has begun making dietary changes in an effort to lose weight  He wants to lose at least 20 pounds and eventually get off GERD medication. --Previously declined EGD for Barrett's esophagus screening   # Colon cancer screening. He is still not interested in ANY type of colon cancer screening. Patient says it is on his to do list but too busy for any testing right now. I reminded him again that colon cancer is a leading cause of cancer deaths in the country and in most cases it is preventable through screening  # History of HCV, treated.  Negative HCVRNA by PCR March 2020.   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 No definite hepatic mass or nodularity.  Incomplete pancreatic visualization   ULTRASOUND HEPATIC ELASTOGRAPHY:   Median hepatic shear wave velocity is calculated at 1.54 m/sec.   Corresponding Metavir fibrosis score is F2 + some F3.   Risk of fibrosis is Moderate.   Follow-up: Additional testing appropriate   Oct 2010 CTAP with and WO contrast CLINICAL DATA:  Gross hematuria x2 over 6 months.   COMPARISON:  None.   FINDINGS: Lower chest: Atelectasis or scarring in the medial right lower lobe. Normal heart size without pericardial or pleural effusion. Tiny hiatal hernia. Right coronary artery atherosclerosis.   Hepatobiliary: Normal liver. Normal gallbladder, without biliary ductal dilatation.   Pancreas: Normal, without mass or ductal dilatation.   Spleen: Normal in size, without focal abnormality.   Adrenals/Urinary  Tract: Normal adrenal glands. No renal calculi or hydronephrosis. No hydroureter or ureteric calculi. No renal mass on post-contrast imaging. No bladder calculi. Good renal collecting system opacification on delayed images. No enhancing bladder mass or filling defect on delayed images. The right-side of the bladder is positioned at the entrance to the inguinal canal, with transient positioning within on prone image 81/11.   Stomach/Bowel: Normal remainder of the stomach. A small periampullary duodenal diverticulum. Extensive colonic diverticulosis. Normal terminal ileum and appendix. Otherwise normal small bowel.   Vascular/Lymphatic: Aortic and branch vessel atherosclerosis. No abdominopelvic adenopathy.   Reproductive: Mild prostatomegaly.   Other: No significant free fluid. Tiny fat containing ventral abdominal wall hernia.   Musculoskeletal: No acute osseous abnormality.   IMPRESSION: 1.  No acute process or explanation for hematuria. 2. The right-side of the urinary bladder extends towards the right inguinal canal and may enter it with Valsalva. 3.  Tiny hiatal hernia. 4. Coronary artery atherosclerosis. Aortic Atherosclerosis (ICD10-I70.0).     HISTORY OF PRESENT ILLNESS    Chief Complaint : needs refill on Pantoprazole  Patrick Wood is a 60 y.o. male , known to Dr. Loletha Carrow,  with a past medical history  of GERD, diverticulosis, prostamegaly.  Additional medical history as listed in [place] .   Patient was last seen November 2021 for GERD follow up.  Patient is here to get a refill on pantoprazole.  Overall his GERD symptoms are controlled.  He has occasional nocturnal regurgitation if he eats later overeats, This happens about 3 times a year patient  tells me he is just starting to make some dietary changes in an effort to lose at least 20 pounds.  He would like to eventually get off of reflux medication if possible..  He has been reluctant to undergo screening for Barrett's  esophagus.  Additionally he has declined colon cancer screening.  He has no bowel changes or blood in stool.    PREVIOUS ENDOSCOPIC EVALUATIONS / PERTINENT STUDIES:   None   Current Medications, Allergies, Past Medical History, Past Surgical History, Family History and Social History were reviewed in [employer] record.     Current Outpatient Medications  Medication Sig Dispense Refill   pantoprazole (PROTONIX) 40 MG tablet TAKE 1 TABLET BY MOUTH EVERY DAY. 30 tablet 11   No current facility-administered medications for this visit.    Review of Systems: No chest pain. No shortness of breath. No urinary complaints.   PHYSICAL EXAM :    Wt Readings from Last 3 Encounters:  10/24/21 219 lb 8 oz (99.6 kg)  10/01/20 222 lb 12.8 oz (101.1 kg)  06/06/19 217 lb 4 oz (98.5 kg)    BP 138/90   Pulse 80   Ht 5\' 9"  (1.753 m)   Wt 219 lb 8 oz (99.6 kg)   BMI 32.41 kg/m  Constitutional:  Generally well appearing male in no acute distress. Psychiatric: Pleasant. Normal mood and affect. Behavior is normal. EENT: Pupils normal.  Conjunctivae are normal. No scleral icterus. Cardiovascular: Normal rate, regular rhythm. No edema Pulmonary/chest: Effort normal and breath sounds normal. No wheezing, rales or rhonchi. Abdominal: Soft, nondistended, nontender. Bowel sounds active throughout. There are no masses palpable. No hepatomegaly. Neurological: Alert and oriented to person place and time.   , NP  10/24/2021, 10:44 AM

## 2021-10-28 NOTE — Progress Notes (Signed)
____________________________________________________________  Attending physician addendum:  Thank you for sending this case to me. I have reviewed the entire note and agree with the plan.  Thank you for reiterating advice for screening.  Amada Jupiter, MD  ____________________________________________________________

## 2022-01-16 ENCOUNTER — Other Ambulatory Visit: Payer: Self-pay | Admitting: Nurse Practitioner

## 2022-06-30 ENCOUNTER — Other Ambulatory Visit: Payer: Self-pay | Admitting: *Deleted

## 2022-06-30 NOTE — Patient Outreach (Signed)
  Care Coordination   06/30/2022 Name: Patrick Wood MRN: [id number] DOB: December 03, 1960   Care Coordination Outreach Attempts:  An unsuccessful telephone outreach was attempted today to offer the patient information about available care coordination services as a benefit of their health plan.   Follow Up Plan:  Additional outreach attempts will be made to offer the patient care coordination information and services.   Encounter Outcome:  No Answer  Care Coordination Interventions Activated:  No   Care Coordination Interventions:  No, not indicated    Greysen Swanton C. Burgess Estelle, MSN, [facility] Gerontological Nurse Practitioner [facility] Care Management [phone]

## 2022-07-05 ENCOUNTER — Other Ambulatory Visit: Payer: Self-pay | Admitting: *Deleted

## 2022-07-05 NOTE — Patient Outreach (Signed)
  Care Coordination   07/05/2022 Name: Patrick Wood MRN: [id number] DOB: 12-23-1960   Care Coordination Outreach Attempts:  A second unsuccessful outreach was attempted today to offer the patient with information about available care coordination services as a benefit of their health plan.     Follow Up Plan:  Additional outreach attempts will be made to offer the patient care coordination information and services.   Encounter Outcome:  No Answer  Care Coordination Interventions Activated:  No   Care Coordination Interventions:  No, not indicated    Karan Ramnauth C. Burgess Estelle, MSN, [facility] Gerontological Nurse Practitioner [facility] Care Management [phone]

## 2022-07-14 ENCOUNTER — Telehealth: Payer: Self-pay | Admitting: Physician Assistant

## 2022-07-14 ENCOUNTER — Ambulatory Visit (INDEPENDENT_AMBULATORY_CARE_PROVIDER_SITE_OTHER): Payer: 59 | Admitting: Physician Assistant

## 2022-07-14 ENCOUNTER — Encounter: Payer: Self-pay | Admitting: Physician Assistant

## 2022-07-14 VITALS — BP 130/80 | HR 98 | Temp 98.6°F | Ht 69.0 in | Wt 205.5 lb

## 2022-07-14 DIAGNOSIS — J0110 Acute frontal sinusitis, unspecified: Secondary | ICD-10-CM | POA: Diagnosis not present

## 2022-07-14 MED ORDER — DOXYCYCLINE HYCLATE 100 MG PO TABS: 100.0000 mg | ORAL_TABLET | Freq: Two times a day (BID) | ORAL | 0 refills | Status: DC

## 2022-07-14 MED ORDER — PREDNISONE 20 MG PO TABS: 20.0000 mg | ORAL_TABLET | Freq: Every day | ORAL | 0 refills | Status: DC

## 2022-07-14 NOTE — Telephone Encounter (Signed)
Spoke to pt told him both Rx's were [facility] to [facility] [place]. Pt verbalized understanding.

## 2022-07-14 NOTE — Telephone Encounter (Signed)
Caller states: -patient seen today (08/25) -prescribed two medications -only pharmacy [facility] let [facility] use is [facility].   Caller requests: -Resend of RX to pharmacy listed below.     [facility] ID: [phone] [id number]  [place], [place] [address] [phone]

## 2022-07-14 NOTE — Patient Instructions (Signed)
It was great to see you!  Start prednisone for pain/inflammation --> do not take advil or ibuprofen while on this  Start doxycycline antibiotic for your infection  Keep me posted!  Take care,  Jarold Motto PA-C
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
## 2022-07-18 ENCOUNTER — Telehealth: Payer: Self-pay | Admitting: *Deleted

## 2022-07-18 NOTE — Patient Outreach (Signed)
  Care Coordination   07/18/2022 Name: SOUL HACKMAN MRN: [id number] DOB: 05-27-1961   Care Coordination Outreach Attempts:  A third unsuccessful outreach was attempted today to offer the patient with information about available care coordination services as a benefit of their health plan.   Follow Up Plan:  No further outreach attempts will be made at this time. We have been unable to contact the patient to offer or enroll patient in care coordination services  Encounter Outcome:  No Answer  Care Coordination Interventions Activated:  No   Care Coordination Interventions:  No, not indicated    Irving Shows [facility], BSN RN Care Coordinator [phone]

## 2022-08-03 ENCOUNTER — Encounter: Payer: Self-pay | Admitting: Family

## 2022-08-03 ENCOUNTER — Ambulatory Visit: Payer: 59 | Admitting: Family

## 2022-08-03 VITALS — BP 175/110 | HR 89 | Temp 98.7°F | Ht 69.0 in | Wt 201.8 lb

## 2022-08-03 DIAGNOSIS — I10 Essential (primary) hypertension: Secondary | ICD-10-CM | POA: Insufficient documentation

## 2022-08-03 DIAGNOSIS — R6884 Jaw pain: Secondary | ICD-10-CM

## 2022-08-03 MED ORDER — NAPROXEN 500 MG PO TABS: 500.0000 mg | ORAL_TABLET | Freq: Two times a day (BID) | ORAL | 0 refills | Status: DC

## 2022-08-03 MED ORDER — CYCLOBENZAPRINE HCL 10 MG PO TABS: 10.0000 mg | ORAL_TABLET | Freq: Three times a day (TID) | ORAL | 0 refills | Status: DC | PRN

## 2022-08-03 NOTE — Assessment & Plan Note (Signed)
   very high BP today  pt also reports continuous jaw pain, denies HA or dizziness, leg swelling  elevated readings in past- dx in 2019 given Losartan after hospital stay for Guillain-Barre, but did not continue med once ran out  sending Olmesartan 20mg  qd, advised on use & SE  f/u 1 month
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
## 2022-08-03 NOTE — Patient Instructions (Addendum)
It was very nice to see you today!   I have sent over a muscle relaxer to your pharmacy, ok to take 1 pill twice during the day as needed for jaw pain, and 1-2 pills at bedtime. I also am sending Naproxen to take twice a day which is stronger than the Ibuprofen/Motrin you have at home - DO NOT continue to take this with the Naproxen.  See the handout attached for more tips to treat your pain.  I also have sent a referral to an oral surgeon to further assess and possibly order imaging of your jaw.     PLEASE NOTE:  If you had any lab tests please let us know if you have not heard back within a few days. You may see your results on MyChart before we have a chance to review them but we will give you a call once they are reviewed by [place]. If we ordered any referrals today, please let us know if you have not heard from their office within the next week.

## 2022-08-06 MED ORDER — OLMESARTAN MEDOXOMIL 20 MG PO TABS: 20.0000 mg | ORAL_TABLET | Freq: Every day | ORAL | 2 refills | Status: DC

## 2022-08-15 ENCOUNTER — Other Ambulatory Visit: Payer: Self-pay | Admitting: Family

## 2022-08-15 DIAGNOSIS — R6884 Jaw pain: Secondary | ICD-10-CM

## 2022-09-05 ENCOUNTER — Emergency Department (HOSPITAL_COMMUNITY): Payer: 59

## 2022-09-05 ENCOUNTER — Other Ambulatory Visit: Payer: Self-pay

## 2022-09-05 ENCOUNTER — Observation Stay (HOSPITAL_COMMUNITY)
Admission: EM | Admit: 2022-09-05 | Discharge: 2022-09-07 | Disposition: A | Discharge status: Home / Self Care | Payer: 59 | Attending: Internal Medicine | Admitting: Internal Medicine

## 2022-09-05 ENCOUNTER — Encounter (HOSPITAL_COMMUNITY): Payer: Self-pay | Admitting: Emergency Medicine

## 2022-09-05 DIAGNOSIS — K269 Duodenal ulcer, unspecified as acute or chronic, without hemorrhage or perforation: Secondary | ICD-10-CM | POA: Diagnosis not present

## 2022-09-05 DIAGNOSIS — R1110 Vomiting, unspecified: Secondary | ICD-10-CM | POA: Diagnosis not present

## 2022-09-05 DIAGNOSIS — Z8673 Personal history of transient ischemic attack (TIA), and cerebral infarction without residual deficits: Secondary | ICD-10-CM | POA: Insufficient documentation

## 2022-09-05 DIAGNOSIS — R1310 Dysphagia, unspecified: Secondary | ICD-10-CM | POA: Diagnosis not present

## 2022-09-05 DIAGNOSIS — R933 Abnormal findings on diagnostic imaging of other parts of digestive tract: Secondary | ICD-10-CM

## 2022-09-05 DIAGNOSIS — K2289 Other specified disease of esophagus: Secondary | ICD-10-CM | POA: Insufficient documentation

## 2022-09-05 DIAGNOSIS — R519 Headache, unspecified: Secondary | ICD-10-CM | POA: Diagnosis not present

## 2022-09-05 DIAGNOSIS — K259 Gastric ulcer, unspecified as acute or chronic, without hemorrhage or perforation: Secondary | ICD-10-CM | POA: Diagnosis not present

## 2022-09-05 DIAGNOSIS — R634 Abnormal weight loss: Secondary | ICD-10-CM | POA: Diagnosis not present

## 2022-09-05 DIAGNOSIS — R112 Nausea with vomiting, unspecified: Secondary | ICD-10-CM | POA: Diagnosis not present

## 2022-09-05 DIAGNOSIS — I10 Essential (primary) hypertension: Secondary | ICD-10-CM | POA: Diagnosis not present

## 2022-09-05 DIAGNOSIS — R627 Adult failure to thrive: Secondary | ICD-10-CM | POA: Insufficient documentation

## 2022-09-05 DIAGNOSIS — Z87891 Personal history of nicotine dependence: Secondary | ICD-10-CM | POA: Insufficient documentation

## 2022-09-05 DIAGNOSIS — Z8546 Personal history of malignant neoplasm of prostate: Secondary | ICD-10-CM | POA: Insufficient documentation

## 2022-09-05 DIAGNOSIS — Z7982 Long term (current) use of aspirin: Secondary | ICD-10-CM | POA: Diagnosis not present

## 2022-09-05 DIAGNOSIS — K2210 Ulcer of esophagus without bleeding: Secondary | ICD-10-CM | POA: Insufficient documentation

## 2022-09-05 DIAGNOSIS — K449 Diaphragmatic hernia without obstruction or gangrene: Secondary | ICD-10-CM | POA: Diagnosis not present

## 2022-09-05 DIAGNOSIS — K5730 Diverticulosis of large intestine without perforation or abscess without bleeding: Secondary | ICD-10-CM | POA: Diagnosis not present

## 2022-09-05 DIAGNOSIS — Z79899 Other long term (current) drug therapy: Secondary | ICD-10-CM | POA: Diagnosis not present

## 2022-09-05 HISTORY — DX: Vomiting, unspecified: R11.10

## 2022-09-05 LAB — URINALYSIS, ROUTINE W REFLEX MICROSCOPIC
Bilirubin Urine: NEGATIVE
Glucose, UA: NEGATIVE mg/dL
Hgb urine dipstick: NEGATIVE
Ketones, ur: 20 mg/dL — AB
Leukocytes,Ua: NEGATIVE
Nitrite: NEGATIVE
Protein, ur: 30 mg/dL — AB
Specific Gravity, Urine: 1.027 (ref 1.005–1.030)
pH: 5 (ref 5.0–8.0)

## 2022-09-05 LAB — CBC
HCT: 45.1 % (ref 39.0–52.0)
Hemoglobin: 14.8 g/dL (ref 13.0–17.0)
MCH: 29.8 pg (ref 26.0–34.0)
MCHC: 32.8 g/dL (ref 30.0–36.0)
MCV: 90.9 fL (ref 80.0–100.0)
Platelets: 306 10*3/uL (ref 150–400)
RBC: 4.96 MIL/uL (ref 4.22–5.81)
RDW: 13.3 % (ref 11.5–15.5)
WBC: 12.4 10*3/uL — ABNORMAL HIGH (ref 4.0–10.5)
nRBC: 0 % (ref 0.0–0.2)

## 2022-09-05 LAB — COMPREHENSIVE METABOLIC PANEL
ALT: 15 U/L (ref 0–44)
AST: 14 U/L — ABNORMAL LOW (ref 15–41)
Albumin: 4.1 g/dL (ref 3.5–5.0)
Alkaline Phosphatase: 61 U/L (ref 38–126)
Anion gap: 13 (ref 5–15)
BUN: 27 mg/dL — ABNORMAL HIGH (ref 8–23)
CO2: 29 mmol/L (ref 22–32)
Calcium: 12.2 mg/dL — ABNORMAL HIGH (ref 8.9–10.3)
Chloride: 99 mmol/L (ref 98–111)
Creatinine, Ser: 1.41 mg/dL — ABNORMAL HIGH (ref 0.61–1.24)
GFR, Estimated: 57 mL/min — ABNORMAL LOW (ref >60)
Glucose, Bld: 123 mg/dL — ABNORMAL HIGH (ref 70–99)
Potassium: 4.3 mmol/L (ref 3.5–5.1)
Sodium: 141 mmol/L (ref 135–145)
Total Bilirubin: 0.6 mg/dL (ref 0.3–1.2)
Total Protein: 8.7 g/dL — ABNORMAL HIGH (ref 6.5–8.1)

## 2022-09-05 LAB — MAGNESIUM: Magnesium: 2.1 mg/dL (ref 1.7–2.4)

## 2022-09-05 MED ORDER — SENNOSIDES-DOCUSATE SODIUM 8.6-50 MG PO TABS: 1.0000 | ORAL_TABLET | Freq: Every evening | ORAL | Status: DC | PRN

## 2022-09-05 MED ORDER — KETOROLAC TROMETHAMINE 15 MG/ML IJ SOLN
15.0000 mg | Freq: Once | INTRAMUSCULAR | Status: AC
  Administered 2022-09-05: 15 mg via INTRAVENOUS
  Filled 2022-09-05: qty 1

## 2022-09-05 MED ORDER — ONDANSETRON HCL 4 MG/2ML IJ SOLN: 4.0000 mg | Freq: Four times a day (QID) | INTRAMUSCULAR | Status: DC | PRN

## 2022-09-05 MED ORDER — IRBESARTAN 150 MG PO TABS
150.0000 mg | ORAL_TABLET | Freq: Every day | ORAL | Status: DC
  Administered 2022-09-05 – 2022-09-07 (×3): 150 mg via ORAL
  Filled 2022-09-05 (×3): qty 1

## 2022-09-05 MED ORDER — ACETAMINOPHEN 650 MG RE SUPP: 650.0000 mg | Freq: Four times a day (QID) | RECTAL | Status: DC | PRN

## 2022-09-05 MED ORDER — HYDRALAZINE HCL 20 MG/ML IJ SOLN
10.0000 mg | Freq: Once | INTRAMUSCULAR | Status: AC
  Administered 2022-09-05: 10 mg via INTRAVENOUS
  Filled 2022-09-05: qty 1

## 2022-09-05 MED ORDER — ONDANSETRON HCL 4 MG/2ML IJ SOLN
4.0000 mg | Freq: Once | INTRAMUSCULAR | Status: AC
  Administered 2022-09-05: 4 mg via INTRAVENOUS
  Filled 2022-09-05: qty 2

## 2022-09-05 MED ORDER — METOCLOPRAMIDE HCL 5 MG/ML IJ SOLN
10.0000 mg | Freq: Once | INTRAMUSCULAR | Status: AC
  Administered 2022-09-05: 10 mg via INTRAVENOUS
  Filled 2022-09-05: qty 2

## 2022-09-05 MED ORDER — ONDANSETRON HCL 4 MG PO TABS: 4.0000 mg | ORAL_TABLET | Freq: Four times a day (QID) | ORAL | Status: DC | PRN

## 2022-09-05 MED ORDER — DEXTROSE-NACL 5-0.9 % IV SOLN: INTRAVENOUS | Status: DC

## 2022-09-05 MED ORDER — SODIUM CHLORIDE 0.9 % IV BOLUS
1000.0000 mL | Freq: Once | INTRAVENOUS | Status: AC
  Administered 2022-09-05: 1000 mL via INTRAVENOUS

## 2022-09-05 MED ORDER — MORPHINE SULFATE (PF) 4 MG/ML IV SOLN
4.0000 mg | Freq: Once | INTRAVENOUS | Status: AC
  Administered 2022-09-05: 4 mg via INTRAVENOUS
  Filled 2022-09-05: qty 1

## 2022-09-05 MED ORDER — DIPHENHYDRAMINE HCL 50 MG/ML IJ SOLN
25.0000 mg | Freq: Once | INTRAMUSCULAR | Status: AC
  Administered 2022-09-05: 25 mg via INTRAVENOUS
  Filled 2022-09-05: qty 1

## 2022-09-05 MED ORDER — IOHEXOL 300 MG/ML  SOLN
100.0000 mL | Freq: Once | INTRAMUSCULAR | Status: AC | PRN
  Administered 2022-09-05: 100 mL via INTRAVENOUS

## 2022-09-05 MED ORDER — HYDROMORPHONE HCL 1 MG/ML IJ SOLN
0.5000 mg | Freq: Once | INTRAMUSCULAR | Status: AC
  Administered 2022-09-05: 0.5 mg via INTRAVENOUS
  Filled 2022-09-05: qty 0.5

## 2022-09-05 MED ORDER — ACETAMINOPHEN 325 MG PO TABS
650.0000 mg | ORAL_TABLET | Freq: Four times a day (QID) | ORAL | Status: DC | PRN
  Administered 2022-09-05 – 2022-09-06 (×2): 650 mg via ORAL
  Filled 2022-09-05 (×2): qty 2

## 2022-09-05 NOTE — Assessment & Plan Note (Signed)
Ca- 12.2.  Mental status intact.  Does not appear to be symptomatic. -Hydrate for now
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
## 2022-09-05 NOTE — Assessment & Plan Note (Addendum)
Over the past 3 months.  Bilateral temporal areas.  No history of migraines.  Head CT negative for acute abnormality.  History of AIDP/Guillain-Barr. -For now will try headache cocktail with metoclopramide, Benadryl and ketorolac 15 mg. -If no improvement in headaches, we need to check inflammatory markers.

## 2022-09-05 NOTE — ED Provider Notes (Cosign Needed Addendum)
[facility] EMERGENCY DEPARTMENT Provider Note   CSN: [id number] Arrival date & time: 09/05/22  1054     History  Chief Complaint  Patient presents with   Migraine    Patrick Wood is a 61 y.o. male.   Migraine Associated symptoms include headaches. Pertinent negatives include no chest pain, no abdominal pain and no shortness of breath.       Patrick Wood is a 61 y.o. 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  Initially was treated with antibiotics and steroids for sinus infection.  Symptoms did not improve.  Describes throbbing pain to his temples pain is nonradiating.  Improves minimally after over-the-counter Excedrin Migraine.  He also reports 15-monthhistory of nausea and vomiting.  States he is unable to tolerate any solid foods.  Tolerates liquids.  He endorses a 50 pound weight loss since early summer.  Symptoms associated with generalized fatigue and weakness.  He denies any neck pain or stiffness, dizziness, blurred vision, fever, chills, or diarrhea.  Home Medications Prior to Admission medications   Medication Sig Start Date End Date Taking? Authorizing Provider  acetaminophen (TYLENOL) 500 MG tablet Take 1,500 mg by mouth every 6 (six) hours as needed.   Yes [provider]  aspirin-acetaminophen-caffeine (EXCEDRIN MIGRAINE) [id number] tablet Take 2 tablets by mouth every 6 (six) hours as needed for headache.   Yes [provider]  ibuprofen (ADVIL) 200 MG tablet Take 600 mg by mouth in the morning, at noon, and at bedtime.   Yes [provider]  olmesartan (BENICAR) 20 MG tablet Take 1 tablet (20 mg total) by mouth daily. 08/06/22  Yes HJeanie Sewer NP  cyclobenzaprine (FLEXERIL) 10 MG tablet Take 1-2 tablets (10-20 mg total) by mouth 3 (three) times  daily as needed for muscle spasms. Take 1 tab bid prn, 1-2 tabs qhs prn 08/03/22   Hudnell, SColletta [place] NP  diazepam (VALIUM) 5 MG tablet TAKE 1 TABLET BY MOUTH RIGHT BEFORE BEDTIME Patient not taking: Reported on 09/05/2022 08/23/22   [provider]  naproxen (NAPROSYN) 500 MG tablet Take 1 tablet (500 mg total) by mouth 2 (two) times daily with a meal. Take for jaw pain. Patient not taking: Reported on 09/05/2022 08/03/22   HJeanie Sewer NP      Allergies    Patient has no known allergies.    Review of Systems   Review of Systems  Constitutional:  Positive for activity change, appetite change and fatigue. Negative for chills and fever.  Eyes:  Negative for photophobia and visual disturbance.  Respiratory:  Negative for cough, chest tightness and shortness of breath.   Cardiovascular:  Negative for chest pain.  Gastrointestinal:  Positive for nausea and vomiting. Negative for abdominal distention, abdominal pain, constipation and diarrhea.  Genitourinary:  Negative for dysuria.  Musculoskeletal:  Negative for arthralgias, myalgias, neck pain and neck stiffness.  Neurological:  Positive for weakness (Generalized weakness) and headaches. Negative for dizziness, syncope, facial asymmetry and numbness.    Physical Exam Updated Vital Signs BP (!) 145/101   Pulse 88   Temp 99.2 F (37.3 C) (Oral)   Resp 14   Ht 5' 9"  (1.753 m)   Wt 91.2 kg   SpO2 97%   BMI 29.68 kg/m  Physical Exam Vitals and nursing note reviewed.  Constitutional:  General: He is not in acute distress.    Appearance: Normal appearance. He is not ill-appearing or toxic-appearing.  HENT:     Head: Atraumatic.     Right Ear: Tympanic membrane and ear canal normal.     Left Ear: Tympanic membrane and ear canal normal.     Mouth/Throat:     Mouth: Mucous membranes are moist.     Pharynx: Oropharynx is clear. No oropharyngeal exudate or posterior oropharyngeal erythema.  Eyes:     Extraocular  Movements: Extraocular movements intact.     Conjunctiva/sclera: Conjunctivae normal.     Pupils: Pupils are equal, round, and reactive to light.  Neck:     Meningeal: Kernig's sign absent.  Cardiovascular:     Rate and Rhythm: Normal rate and regular rhythm.     Pulses: Normal pulses.  Pulmonary:     Effort: Pulmonary effort is normal.  Abdominal:     General: There is no distension.     Palpations: Abdomen is soft. There is no mass.     Tenderness: There is no abdominal tenderness.  Musculoskeletal:     Cervical back: Full passive range of motion without pain and normal range of motion.     Right lower leg: No edema.     Left lower leg: No edema.  Skin:    General: Skin is warm.     Capillary Refill: Capillary refill takes less than 2 seconds.     Findings: No rash.  Neurological:     General: No focal deficit present.     Mental Status: He is alert.     Sensory: No sensory deficit.     Motor: No weakness.     Comments: CN II through XII intact.  Speech clear.  No facial droop.  No pronator drift.     ED Results / Procedures / Treatments   Labs (all labs ordered are listed, but only abnormal results are displayed) Labs Reviewed  COMPREHENSIVE METABOLIC PANEL - Abnormal; Notable for the following components:      Result Value   Glucose, Bld 123 (*)    BUN 27 (*)    Creatinine, Ser 1.41 (*)    Calcium 12.2 (*)    Total Protein 8.7 (*)    AST 14 (*)    GFR, Estimated 57 (*)    All other components within normal limits  CBC - Abnormal; Notable for the following components:   WBC 12.4 (*)    All other components within normal limits  URINALYSIS, ROUTINE W REFLEX MICROSCOPIC - Abnormal; Notable for the following components:   Ketones, ur 20 (*)    Protein, ur 30 (*)    Bacteria, UA RARE (*)    All other components within normal limits    EKG None  Radiology CT ABDOMEN PELVIS W CONTRAST  Result Date: 09/05/2022 CLINICAL DATA:  Unintentional weight loss and  loss of appetite. EXAM: CT ABDOMEN AND PELVIS WITH CONTRAST TECHNIQUE: Multidetector CT imaging of the abdomen and pelvis was performed using the standard protocol following bolus administration of intravenous contrast. RADIATION DOSE REDUCTION: This exam was performed according to the departmental dose-optimization program which includes automated exposure control, adjustment of the mA and/or kV according to patient size and/or use of iterative reconstruction technique. CONTRAST:  124m OMNIPAQUE IOHEXOL 300 MG/ML  SOLN COMPARISON:  CT scan from 2019 FINDINGS: Lower chest: Marked thickening of the distal esophageal wall highly suspicious for esophageal cancer. Definite change since the prior CT scan. Recommend  GI consultation and endoscopic evaluation. No paraesophageal lymphadenopathy. The heart is normal in size. No pericardial effusion. Age advanced coronary artery calcifications are noted. The lungs are clear of an acute process. No obvious pulmonary lesions. Exam is somewhat degraded by breathing motion artifact. No pleural effusions or pleural lesions. Hepatobiliary: No hepatic lesions to suggest hepatic metastatic disease. 7 mm gastrohepatic ligament lymph node is indeterminate. No celiac axis or periportal adenopathy. No intrahepatic biliary dilatation. The gallbladder is unremarkable. No common bile duct dilatation. Pancreas: No mass, inflammation or ductal dilatation. Small duodenal diverticulum noted near the pancreatic head. Spleen: Normal size.  No focal lesions. Adrenals/Urinary Tract: Adrenal glands are normal. No renal lesions or hydronephrosis. The bladder is unremarkable. Stomach/Bowel: The stomach, duodenum, small and colon are unremarkable. No acute inflammatory process, mass lesions or obstructive findings. The terminal ileum and appendix are normal. Descending and sigmoid colon diverticulosis without findings for acute diverticulitis. Vascular/Lymphatic: Significant age advanced  atherosclerotic calcifications involving the aorta and iliac arteries. No aneurysm dissection. The major venous structures are patent. No mesenteric or retroperitoneal mass or adenopathy. Reproductive: Enlarged prostate gland with median lobe hypertrophy the impressing on the base of the bladder. The seminal vesicles are unremarkable. Other: No pelvic mass or adenopathy. No free pelvic fluid collections. No inguinal mass or adenopathy. No abdominal wall hernia or subcutaneous lesions. Musculoskeletal: No significant bony findings. No worrisome bone lesions. IMPRESSION: 1. Marked thickening of the distal esophageal wall highly suspicious for esophageal cancer. Recommend GI consultation and endoscopic evaluation. 2. 7 mm gastrohepatic ligament lymph node is indeterminate. No paraesophageal, celiac axis or periportal adenopathy. 3. No findings for hepatic metastatic disease. 4. Age advanced atherosclerotic calcifications involving the coronary arteries, abdominal aorta and iliac arteries. 5. Enlarged prostate gland with median lobe hypertrophy impressing on the base of the bladder. 6. Aortic atherosclerosis. Aortic Atherosclerosis (ICD10-I70.0). Electronically Signed   By: Marijo Sanes M.D.   On: 09/05/2022 15:35   CT Head Wo Contrast  Result Date: 09/05/2022 CLINICAL DATA:  Headache EXAM: CT HEAD WITHOUT CONTRAST TECHNIQUE: Contiguous axial images were obtained from the base of the skull through the vertex without intravenous contrast. RADIATION DOSE REDUCTION: This exam was performed according to the departmental dose-optimization program which includes automated exposure control, adjustment of the mA and/or kV according to patient size and/or use of iterative reconstruction technique. COMPARISON:  06/15/2018 FINDINGS: Brain: No evidence of acute infarction, hemorrhage, hydrocephalus, extra-axial collection or mass lesion/mass effect. Similar scattered low-density changes within the periventricular and  subcortical white matter most compatible with chronic microvascular ischemic change. Vascular: No hyperdense vessel or unexpected calcification. Skull: Normal. Negative for fracture or focal lesion. Sinuses/Orbits: No acute finding. Other: None. IMPRESSION: 1. No acute intracranial findings. 2. Chronic microvascular ischemic change of the white matter. Electronically Signed   By: Davina Poke D.O.   On: 09/05/2022 12:41    Procedures Procedures    Medications Ordered in ED Medications  ondansetron (ZOFRAN) injection 4 mg (4 mg Intravenous Given 09/05/22 1500)  morphine (PF) 4 MG/ML injection 4 mg (4 mg Intravenous Given 09/05/22 1500)  iohexol (OMNIPAQUE) 300 MG/ML solution 100 mL (100 mLs Intravenous Contrast Given 09/05/22 1516)  sodium chloride 0.9 % bolus 1,000 mL (1,000 mLs Intravenous New Bag/Given 09/05/22 1603)    ED Course/ Medical Decision Making/ A&P                           Medical Decision Making Patient here for  evaluation of persistent headache for several months.  Describes throbbing pain to his bilateral temples.  Pain is nonradiating.  Not associated with photophobia neck pain or stiffness.  He also endorses a 50 pound weight loss over the last several months with repeated vomiting when he attempts to eat solid foods.  Tolerating liquids.  On exam, patient nontoxic-appearing.  Vital signs are reassuring.  Somewhat frail appearing for age.  No nuchal rigidity or other focal neurodeficits.  Endorses significant weight loss over the last several months with continued vomiting.  His abdomen is soft and nontender on exam.  Patient's complaints carry multiple risk.  Differential would include but not limited to migraine headache, brain lesion, subarachnoid, temporal arteritis.  Patient reports recently receiving steroid injection from PCP office, felt this may give falsely elevated ESR.  With patient's reported weight loss and vomiting, I am concerned about malignancy.  Will  obtain labs, CT head, CT abdomen pelvis  Amount and/or Complexity of Data Reviewed Labs: ordered.    Details: Labs interpreted by me, no significant leukocytosis, hemoglobin unremarkable.  Chemistries show elevated BUN of 27 and serum creatinine of 1.4 and hypercalcemia with calcium of 12.2.  Likely new AKI, no recent chemistries for comparison.  Elevated from last creatinine of 3 years ago.  Urinalysis showed proteinuria without evidence of infection Radiology: ordered.    Details: CT head was obtained, no cute intracranial finding  CT abdomen and pelvis shows marked thickening of the distal esophageal wall suspicious for esophageal cancer Discussion of management or test interpretation with external provider(s): Patient here with likely AKI and hypercalcemia.  We will need hospital admission for continued IV fluids and recheck of calcium.  Discussed findings with GI, Dr. Jenetta Downer who recommends patient be n.p.o. after midnight continue IV fluids with recheck of calcium level. Plans for endoscopy tomorrow.  Discussed findings with [facility] hospitalist, Dr. Denton Brick who agrees to admit  Risk Prescription drug management. Decision regarding hospitalization.           Final Clinical Impression(s) / ED Diagnoses Final diagnoses:  Intractable vomiting  Acute intractable headache, unspecified headache type    Rx / DC Orders ED Discharge Orders     None         Kem Parkinson, PA-C 09/05/22 1901    Kem Parkinson, PA-C 09/05/22 Donnetta Hail, MD 09/07/22 [phone]

## 2022-09-05 NOTE — Assessment & Plan Note (Signed)
Still 140s to 160s. -Resume olmesartan

## 2022-09-05 NOTE — ED Triage Notes (Signed)
Pt c/o constant "migraine" x 2 months. Seen pcp and was given abx for sinus infection. Pt c/o n/v x 3 months, states will nibble during the day due to no appetite and then vomits it all back up at night. A/o. Nad. Ambulatory. Pt c/o fatigue. Denies blurred vision, dizziness/diarrhea. Pt states has lost approx 10 lbs in last month.

## 2022-09-05 NOTE — Assessment & Plan Note (Addendum)
Intractable vomiting with dysphagia.  CT abdomen and pelvis- Marked thickening of the distal esophageal wall highly suspicious for esophageal cancer. Recommend GI consultation and endoscopic evaluation. -N.p.o. midnight -GI to see in a.m. -Reports productive cough, Tmax 99.2, WBC of 12.4, will get chest x-ray - 2 L bolus given continue D5 N/s 100cc/hr x 1 day

## 2022-09-06 ENCOUNTER — Encounter (HOSPITAL_COMMUNITY): Payer: Self-pay | Admitting: Internal Medicine

## 2022-09-06 ENCOUNTER — Observation Stay (HOSPITAL_BASED_OUTPATIENT_CLINIC_OR_DEPARTMENT_OTHER): Payer: 59 | Admitting: Anesthesiology

## 2022-09-06 ENCOUNTER — Encounter (HOSPITAL_COMMUNITY)
Admission: EM | Disposition: A | Discharge status: Home / Self Care | Payer: Self-pay | Source: Home / Self Care | Attending: Emergency Medicine

## 2022-09-06 ENCOUNTER — Observation Stay (HOSPITAL_COMMUNITY): Payer: 59

## 2022-09-06 ENCOUNTER — Other Ambulatory Visit: Payer: Self-pay

## 2022-09-06 ENCOUNTER — Observation Stay (HOSPITAL_COMMUNITY): Payer: 59 | Admitting: Anesthesiology

## 2022-09-06 DIAGNOSIS — K279 Peptic ulcer, site unspecified, unspecified as acute or chronic, without hemorrhage or perforation: Secondary | ICD-10-CM

## 2022-09-06 DIAGNOSIS — K2210 Ulcer of esophagus without bleeding: Secondary | ICD-10-CM

## 2022-09-06 DIAGNOSIS — K2270 Barrett's esophagus without dysplasia: Secondary | ICD-10-CM | POA: Diagnosis not present

## 2022-09-06 DIAGNOSIS — K269 Duodenal ulcer, unspecified as acute or chronic, without hemorrhage or perforation: Secondary | ICD-10-CM | POA: Diagnosis not present

## 2022-09-06 DIAGNOSIS — Z87891 Personal history of nicotine dependence: Secondary | ICD-10-CM | POA: Diagnosis not present

## 2022-09-06 DIAGNOSIS — K449 Diaphragmatic hernia without obstruction or gangrene: Secondary | ICD-10-CM

## 2022-09-06 DIAGNOSIS — K2289 Other specified disease of esophagus: Secondary | ICD-10-CM | POA: Diagnosis not present

## 2022-09-06 DIAGNOSIS — K3189 Other diseases of stomach and duodenum: Secondary | ICD-10-CM | POA: Diagnosis not present

## 2022-09-06 DIAGNOSIS — R627 Adult failure to thrive: Secondary | ICD-10-CM

## 2022-09-06 DIAGNOSIS — R634 Abnormal weight loss: Secondary | ICD-10-CM

## 2022-09-06 DIAGNOSIS — K259 Gastric ulcer, unspecified as acute or chronic, without hemorrhage or perforation: Secondary | ICD-10-CM

## 2022-09-06 DIAGNOSIS — K319 Disease of stomach and duodenum, unspecified: Secondary | ICD-10-CM | POA: Diagnosis not present

## 2022-09-06 DIAGNOSIS — R918 Other nonspecific abnormal finding of lung field: Secondary | ICD-10-CM | POA: Diagnosis not present

## 2022-09-06 DIAGNOSIS — R933 Abnormal findings on diagnostic imaging of other parts of digestive tract: Secondary | ICD-10-CM

## 2022-09-06 DIAGNOSIS — R590 Localized enlarged lymph nodes: Secondary | ICD-10-CM | POA: Diagnosis not present

## 2022-09-06 DIAGNOSIS — R1110 Vomiting, unspecified: Secondary | ICD-10-CM | POA: Diagnosis not present

## 2022-09-06 HISTORY — PX: ESOPHAGOGASTRODUODENOSCOPY (EGD) WITH PROPOFOL: SHX5813

## 2022-09-06 HISTORY — PX: BIOPSY: SHX5522

## 2022-09-06 LAB — BASIC METABOLIC PANEL
Anion gap: 8 (ref 5–15)
BUN: 23 mg/dL (ref 8–23)
CO2: 26 mmol/L (ref 22–32)
Calcium: 9.1 mg/dL (ref 8.9–10.3)
Chloride: 105 mmol/L (ref 98–111)
Creatinine, Ser: 1.01 mg/dL (ref 0.61–1.24)
GFR, Estimated: >60 mL/min (ref >60)
Glucose, Bld: 124 mg/dL — ABNORMAL HIGH (ref 70–99)
Potassium: 3.5 mmol/L (ref 3.5–5.1)
Sodium: 139 mmol/L (ref 135–145)

## 2022-09-06 LAB — CBC
HCT: 36.3 % — ABNORMAL LOW (ref 39.0–52.0)
Hemoglobin: 12.1 g/dL — ABNORMAL LOW (ref 13.0–17.0)
MCH: 29.8 pg (ref 26.0–34.0)
MCHC: 33.3 g/dL (ref 30.0–36.0)
MCV: 89.4 fL (ref 80.0–100.0)
Platelets: 331 10*3/uL (ref 150–400)
RBC: 4.06 MIL/uL — ABNORMAL LOW (ref 4.22–5.81)
RDW: 13.1 % (ref 11.5–15.5)
WBC: 8.3 10*3/uL (ref 4.0–10.5)
nRBC: 0 % (ref 0.0–0.2)

## 2022-09-06 SURGERY — ESOPHAGOGASTRODUODENOSCOPY (EGD) WITH PROPOFOL
Anesthesia: General

## 2022-09-06 MED ORDER — SODIUM CHLORIDE 0.9 % IV SOLN: INTRAVENOUS | Status: DC

## 2022-09-06 MED ORDER — BUTALBITAL-APAP-CAFFEINE 50-325-40 MG PO TABS
1.0000 | ORAL_TABLET | Freq: Four times a day (QID) | ORAL | Status: DC | PRN
  Administered 2022-09-06 – 2022-09-07 (×3): 1 via ORAL
  Filled 2022-09-06 (×3): qty 1

## 2022-09-06 MED ORDER — AMLODIPINE BESYLATE 5 MG PO TABS
5.0000 mg | ORAL_TABLET | Freq: Every day | ORAL | Status: DC
  Administered 2022-09-06 – 2022-09-07 (×2): 5 mg via ORAL
  Filled 2022-09-06 (×2): qty 1

## 2022-09-06 MED ORDER — PROPOFOL 500 MG/50ML IV EMUL
INTRAVENOUS | Status: DC | PRN
  Administered 2022-09-06: 180 ug/kg/min via INTRAVENOUS

## 2022-09-06 MED ORDER — LIDOCAINE 2% (20 MG/ML) 5 ML SYRINGE
INTRAMUSCULAR | Status: DC | PRN
  Administered 2022-09-06: 50 mg via INTRAVENOUS

## 2022-09-06 MED ORDER — PROPOFOL 500 MG/50ML IV EMUL
INTRAVENOUS | Status: AC
  Filled 2022-09-06: qty 50

## 2022-09-06 MED ORDER — SUCRALFATE 1 GM/10ML PO SUSP
1.0000 g | Freq: Three times a day (TID) | ORAL | Status: DC
  Administered 2022-09-06 – 2022-09-07 (×4): 1 g via ORAL
  Filled 2022-09-06 (×4): qty 10

## 2022-09-06 MED ORDER — PANTOPRAZOLE SODIUM 40 MG IV SOLR
40.0000 mg | Freq: Two times a day (BID) | INTRAVENOUS | Status: DC
  Administered 2022-09-06 – 2022-09-07 (×3): 40 mg via INTRAVENOUS
  Filled 2022-09-06 (×3): qty 10

## 2022-09-06 MED ORDER — LACTATED RINGERS IV SOLN: INTRAVENOUS | Status: DC

## 2022-09-06 MED ORDER — IOHEXOL 300 MG/ML  SOLN
75.0000 mL | Freq: Once | INTRAMUSCULAR | Status: AC | PRN
  Administered 2022-09-06: 75 mL via INTRAVENOUS

## 2022-09-06 MED ORDER — HYDRALAZINE HCL 20 MG/ML IJ SOLN: 10.0000 mg | INTRAMUSCULAR | Status: DC | PRN

## 2022-09-06 MED ORDER — PROPOFOL 10 MG/ML IV BOLUS
INTRAVENOUS | Status: DC | PRN
  Administered 2022-09-06: 80 mg via INTRAVENOUS

## 2022-09-06 NOTE — Op Note (Addendum)
[facility] Patient Name: Patrick Wood Procedure Date: 09/06/2022 1:36 PM MRN: [id number] Date of Birth: 1961/08/09 Attending MD: Katrinka Blazing ,  CSN: [id number] Age: 61 Admit Type: Outpatient Procedure:                Upper GI endoscopy Indications:              Abnormal CT of the GI tract, Failure to thrive,                            Vomiting, Weight loss Providers:                Katrinka Blazing, Jannett Celestine, RN, Hinton Rao Referring MD:              Medicines:                Monitored Anesthesia Care Complications:            No immediate complications. Estimated Blood Loss:     Estimated blood loss: none. Procedure:                Pre-Anesthesia Assessment:                           - ASA Grade Assessment: III - A patient with severe                            systemic disease.                           After obtaining informed consent, the endoscope was                            passed under direct vision. Throughout the                            procedure, the patient's blood pressure, pulse, and                            oxygen saturations were monitored continuously. The                            [id number]-[id number] ([id number]) scope was introduced through the                            mouth, and advanced to the second part of duodenum.                            The upper GI endoscopy was accomplished without                            difficulty. The patient tolerated the procedure                            well. Scope In: 1:55:14 PM Scope Out: 2:13:51 PM Total Procedure Duration: 0 hours 18 minutes 37 seconds  Findings:      The esophagus and gastroesophageal junction were examined with white  light and narrow band imaging (NBI). There were esophageal mucosal       changes suggestive of long-segment Barrett's esophagus. These changes       involved the mucosa extending to the Z-line (23 cm from the incisors).       Salmon-colored mucosa was present ,  which was scattered through areas of       ulceration. The maximum longitudinal extent of these esophageal mucosal       changes was 19 cm in length.      One cratered esophageal ulcer with no stigmata of recent bleeding was       found 25 to 42 cm from the incisors. There was associate nodularity in       some of the areas that were spared. The nodularity was elevated and       raised the suspicion fo malignancy. Biopsies were taken with a cold       forceps for histology.      A 4 cm hiatal hernia was present.      Five non-bleeding cratered gastric ulcers with a clean ulcer base       although the largest ulcer had an associated eschar (Forrest Class III),       these were found in the gastric antrum. The largest lesion was 20 mm in       largest dimension, which caused some assocaited pyloric compression but       scope passed easily. Biopsies were taken with a cold forceps for       Helicobacter pylori testing.      One non-bleeding superficial duodenal ulcer with a clean ulcer base       (Forrest Class III) was found in the duodenal bulb and in the first       portion of the duodenum. The lesion was 15 mm in largest dimension. Impression:               - Esophageal mucosal changes suggestive of                            long-segment Barrett's esophagus.                           - Esophageal ulcer with no stigmata of recent                            bleeding, there was associated nodularity                            suspicious fo malignancy. Biopsied.                           - 4 cm hiatal hernia.                           - Non-bleeding gastric ulcers with a clean ulcer                            base (Forrest Class III). Biopsied.                           - Non-bleeding duodenal ulcer with a  clean ulcer                            base (Forrest Class III). Moderate Sedation:      Per Anesthesia Care Recommendation:           - Return patient to hospital ward for ongoing  care.                           - Advance diet as tolerated, can start with liquids                            for now.                           - Use Protonix (pantoprazole) 40 mg IV twice a day.                           - Use sucralfate suspension 1 gram PO QID for 4                            weeks.                           - Await pathology results.                           - Repeat upper endoscopy after studies are complete                            to assess disease activity.                           - Chest CT with IV contrast. Procedure Code(s):        --- Professional ---                           [phone], Esophagogastroduodenoscopy, flexible,                            transoral; with biopsy, single or multiple Diagnosis Code(s):        --- Professional ---                           K22.8, Other specified diseases of esophagus                           K22.10, Ulcer of esophagus without bleeding                           K44.9, Diaphragmatic hernia without obstruction or                            gangrene                           K25.9, Gastric ulcer, unspecified as acute or  chronic, without hemorrhage or perforation                           K26.9, Duodenal ulcer, unspecified as acute or                            chronic, without hemorrhage or perforation                           R62.7, Adult failure to thrive                           R11.10, Vomiting, unspecified                           R63.4, Abnormal weight loss                           R93.3, Abnormal findings on diagnostic imaging of                            other parts of digestive tract CPT copyright 2019 [employer]. All rights reserved. The codes documented in this report are preliminary and upon coder review may  be revised to meet current compliance requirements. Maylon Peppers, MD Maylon Peppers,  09/06/2022 2:33:20 PM This report has been signed  electronically. Number of Addenda: 0

## 2022-09-06 NOTE — Progress Notes (Signed)
  Progress Note Patient: Patrick Wood GUR:[id number] DOB: 07/14/1961 DOA: 09/05/2022  DOS: the patient was seen and examined on 09/06/2022  Brief hospital course: PMH of HTN, hep C, planter fasciitis, prostate cancer, CVA, migraine history taking daily NSAIDs.  Present to the hospital with complaints of nausea and vomiting.  Found to have multiple esophageal and gastric ulcer without any active bleeding. Assessment and Plan: Intractable vomiting Multiple esophageal and gastric ulcer as well as duodenal ulcer Intractable vomiting with dysphagia. CT abdomen and pelvis- Marked thickening of the distal esophageal wall highly suspicious for esophageal cancer. GI consulted. Underwent EGD.  1 cratered esophageal ulcer with nodularity raising suspicion for malignancy.  Biopsy performed. 5 nonbleeding gastric ulcer with clean ulcer base and 1 nonbleeding duodenal ulcer. GI recommended initiate patient on IV PPI and Carafate. Initiate liquid diet and monitor.  Migraine headache. Patient uses multiple Excedrin during the day for migraine headache. Has not seen a headache specialist. Does not take any medication for migraine prophylaxis. We will initiate Fioricet and recommend outpatient follow-up with neurology to establish care for migraine therapy.   Hypercalcemia Continue IV hydration.   Essential hypertension Blood pressure elevated. Continue current regimen.  Subjective: No nausea no vomiting no fever no chills.  No chest pain.  Continues to have headache.  Physical Exam: Vitals:   09/06/22 1237 09/06/22 1313 09/06/22 1337 09/06/22 1418  BP: (!) 153/92  (!) 167/94 (!) 140/100  Pulse: 81 81  78  Resp: 17 14  19   Temp: 98.4 F (36.9 C) 98.1 F (36.7 C)  98.3 F (36.8 C)  TempSrc: Oral Oral    SpO2: 99% 99%  96%  Weight:  86.6 kg    Height:  5\' 9"  (1.753 m)     General: Appear in mild distress; no visible Abnormal Neck Mass Or lumps, Conjunctiva normal Cardiovascular: S1 and S2  Present, no Murmur, Respiratory: good respiratory effort, Bilateral Air entry present and CTA, no Crackles, no wheezes Abdomen: Bowel Sound present, Non tender  Extremities: no Pedal edema Neurology: alert and oriented to time, place, and person  Gait not checked due to patient safety concerns   Data Reviewed: I have Reviewed nursing notes, Vitals, and Lab results since pt's last encounter. Pertinent lab results CBC and BMP I have ordered test including CBC and BMP I have discussed pt's care plan and test results with GI.   Family Communication: Wife at bedside  Disposition: Status is: Observation SCDs Start: 09/05/22 1833   Level of care: Telemetry Telemetry reviewed, shows NSR and Continue Telemetry due to sinus tachycardia   Author: Berle Mull, MD 09/06/2022 6:57 PM Please look on [email or web address] to find out who is on call.

## 2022-09-06 NOTE — Anesthesia Preprocedure Evaluation (Signed)
Anesthesia Evaluation  Patient identified by MRN, date of birth, ID band Patient awake    Reviewed: Allergy & Precautions, H&P , NPO status , Patient's Chart, lab work & pertinent test results, reviewed documented beta blocker date and time   Airway Mallampati: II  TM Distance: >3 FB Neck ROM: full    Dental no notable dental hx.    Pulmonary neg pulmonary ROS, former smoker,    Pulmonary exam normal breath sounds clear to auscultation       Cardiovascular Exercise Tolerance: Good hypertension,  Rhythm:regular Rate:Normal     Neuro/Psych  Headaches,  Neuromuscular disease negative psych ROS   GI/Hepatic GERD  Medicated,(+) Hepatitis -, C  Endo/Other  negative endocrine ROS  Renal/GU negative Renal ROS  negative genitourinary   Musculoskeletal   Abdominal   Peds  Hematology negative hematology ROS (+)   Anesthesia Other Findings   Reproductive/Obstetrics negative OB ROS                             Anesthesia Physical Anesthesia Plan  ASA: 2  Anesthesia Plan: General   Post-op Pain Management:    Induction:   PONV Risk Score and Plan: Propofol infusion  Airway Management Planned:   Additional Equipment:   Intra-op Plan:   Post-operative Plan:   Informed Consent: I have reviewed the patients History and Physical, chart, labs and discussed the procedure including the risks, benefits and alternatives for the proposed anesthesia with the patient or authorized representative who has indicated his/her understanding and acceptance.     Dental Advisory Given  Plan Discussed with: CRNA  Anesthesia Plan Comments:         Anesthesia Quick Evaluation

## 2022-09-06 NOTE — Progress Notes (Signed)
Alert and oriented.  No vomiting due to npo.  Stated he was able to hold down broth last night , but in the last several months he has gradually quit trying to eat since everything comes back up.  Has had a few meds with sips this morning and has held that down.  Consent signed for EGD.  Wife at bedside

## 2022-09-06 NOTE — Hospital Course (Signed)
PMH of HTN, hep C, planter fasciitis, prostate cancer, CVA, migraine history taking daily NSAIDs.  Present to the hospital with complaints of nausea and vomiting.  Found to have multiple esophageal and gastric ulcer without any active bleeding.
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
## 2022-09-06 NOTE — Transfer of Care (Signed)
Immediate Anesthesia Transfer of Care Note  Patient: Patrick Wood  Procedure(s) Performed: ESOPHAGOGASTRODUODENOSCOPY (EGD) WITH PROPOFOL BIOPSY  Patient Location: PACU  Anesthesia Type:General  Level of Consciousness: awake, alert , oriented and patient cooperative  Airway & Oxygen Therapy: Patient Spontanous Breathing  Post-op Assessment: Report given to RN, Post -op Vital signs reviewed and stable and Patient moving all extremities X 4  Post vital signs: Reviewed and stable  Last Vitals:  Vitals Value Taken Time  BP    Temp    Pulse 78 09/06/22 1418  Resp 19 09/06/22 1418  SpO2 96 % 09/06/22 1418  Vitals shown include unvalidated device data.  Last Pain:  Vitals:   09/06/22 1351  TempSrc:   PainSc: 0-No pain      Patients Stated Pain Goal: 5 (71/16/57 9038)  Complications: No notable events documented.

## 2022-09-06 NOTE — Brief Op Note (Signed)
09/05/2022 - 09/06/2022  2:31 PM  PATIENT:  Patrick Wood  61 y.o. male  PRE-OPERATIVE DIAGNOSIS:  nausea, vomiting and abnormal CT imaging of esophagus  POST-OPERATIVE DIAGNOSIS:  hiatal hernia 38-42, gastric ulcers, duodenal, ulcerations up to 42cm , barrets 23  PROCEDURE:  Procedure(s): ESOPHAGOGASTRODUODENOSCOPY (EGD) WITH PROPOFOL (N/A) BIOPSY  SURGEON:  Surgeon(s) and Role:    * Harvel Quale, MD - Primary  Patient underwent EGD under propofol sedation.  Tolerated the procedure adequately. The esophagus and gastroesophageal junction were examined with white light and narrow band imaging (NBI).  There were esophageal mucosal changes suggestive of long-segment Barrett's esophagus.  These changes involved the mucosa extending to the Z-line (23 cm from the incisors).  Salmon-colored mucosa was present , which was scattered through areas of ulceration.  The maximum longitudinal extent of these esophageal mucosal changes was 19 cm in length.   One cratered esophageal ulcer with no stigmata of recent bleeding was found 25 to 42 cm from the incisors. There was associate nodularity in some of the areas that were spared. The nodularity was elevated and raised the suspicion fo malignancy.  Biopsies were taken with a cold forceps for histology. A 4 cm hiatal hernia was present.   Five non-bleeding cratered gastric ulcers with a clean ulcer base although the largest ulcer had an associated eschar (Forrest Class III), these were found in the gastric antrum.  The largest lesion was 20 mm in largest dimension, which caused some assocaited pyloric compression but scope passed easily.  Biopsies were taken with a cold forceps for Helicobacter pylori testing.   One non-bleeding superficial duodenal ulcer with a clean ulcer base (Forrest Class III) was found in the duodenal bulb and in the first portion of the duodenum.  The lesion was 15 mm in largest dimension.   RECOMMENDATIONS - Return  patient to hospital ward for ongoing care.  - Advance diet as tolerated, can start with liquids for now. - Use Protonix (pantoprazole) 40 mg IV twice a day.  - Use sucralfate suspension 1 gram PO QID for 4 weeks.  - Await pathology results.  - Repeat upper endoscopy after studies are complete to assess disease activity.   Maylon Peppers, MD Gastroenterology and Hepatology [facility]

## 2022-09-06 NOTE — Progress Notes (Signed)
Ate all of broth for supper and denies nausea but does c/o headache.  Says it is not a migraine so received tylenol.

## 2022-09-06 NOTE — TOC Progression Note (Signed)
Transition of Care [facility]) - Progression Note    Patient Details  Name: Patrick Wood MRN: [id number] Date of Birth: 1961-05-15  Transition of Care [facility]) CM/SW Contact  Salome Arnt, [place] Phone Number: 09/06/2022, 11:10 AM  Clinical Narrative:  Transition of Care [facility]) Screening Note   Patient Details  Name: Patrick Wood Date of Birth: 01/20/1961   Transition of Care [facility]) CM/SW Contact:    Salome Arnt, LCSW Phone Number: 09/06/2022, 11:11 AM    Transition of Care Department [facility]) has reviewed patient and no TOC needs have been identified at this time. We will continue to monitor patient advancement through interdisciplinary progression rounds. If new patient transition needs arise, please place a TOC consult.             Expected Discharge Plan and Services                                                 Social Determinants of Health (SDOH) Interventions    Readmission Risk Interventions     No data to display

## 2022-09-07 DIAGNOSIS — K2289 Other specified disease of esophagus: Secondary | ICD-10-CM | POA: Diagnosis not present

## 2022-09-07 DIAGNOSIS — K259 Gastric ulcer, unspecified as acute or chronic, without hemorrhage or perforation: Secondary | ICD-10-CM | POA: Diagnosis not present

## 2022-09-07 DIAGNOSIS — R1110 Vomiting, unspecified: Secondary | ICD-10-CM | POA: Diagnosis not present

## 2022-09-07 DIAGNOSIS — R634 Abnormal weight loss: Secondary | ICD-10-CM | POA: Diagnosis not present

## 2022-09-07 DIAGNOSIS — K449 Diaphragmatic hernia without obstruction or gangrene: Secondary | ICD-10-CM | POA: Diagnosis not present

## 2022-09-07 DIAGNOSIS — R933 Abnormal findings on diagnostic imaging of other parts of digestive tract: Secondary | ICD-10-CM | POA: Diagnosis not present

## 2022-09-07 DIAGNOSIS — R627 Adult failure to thrive: Secondary | ICD-10-CM | POA: Diagnosis not present

## 2022-09-07 DIAGNOSIS — K269 Duodenal ulcer, unspecified as acute or chronic, without hemorrhage or perforation: Secondary | ICD-10-CM | POA: Diagnosis not present

## 2022-09-07 DIAGNOSIS — K2210 Ulcer of esophagus without bleeding: Secondary | ICD-10-CM | POA: Diagnosis not present

## 2022-09-07 LAB — CBC
HCT: 35 % — ABNORMAL LOW (ref 39.0–52.0)
Hemoglobin: 11.7 g/dL — ABNORMAL LOW (ref 13.0–17.0)
MCH: 29.8 pg (ref 26.0–34.0)
MCHC: 33.4 g/dL (ref 30.0–36.0)
MCV: 89.3 fL (ref 80.0–100.0)
Platelets: 291 10*3/uL (ref 150–400)
RBC: 3.92 MIL/uL — ABNORMAL LOW (ref 4.22–5.81)
RDW: 13 % (ref 11.5–15.5)
WBC: 6.9 10*3/uL (ref 4.0–10.5)
nRBC: 0 % (ref 0.0–0.2)

## 2022-09-07 LAB — BASIC METABOLIC PANEL
Anion gap: 9 (ref 5–15)
BUN: 17 mg/dL (ref 8–23)
CO2: 25 mmol/L (ref 22–32)
Calcium: 8.7 mg/dL — ABNORMAL LOW (ref 8.9–10.3)
Chloride: 105 mmol/L (ref 98–111)
Creatinine, Ser: 0.85 mg/dL (ref 0.61–1.24)
GFR, Estimated: >60 mL/min (ref >60)
Glucose, Bld: 99 mg/dL (ref 70–99)
Potassium: 3 mmol/L — ABNORMAL LOW (ref 3.5–5.1)
Sodium: 139 mmol/L (ref 135–145)

## 2022-09-07 LAB — HIV ANTIBODY (ROUTINE TESTING W REFLEX): HIV Screen 4th Generation wRfx: NONREACTIVE

## 2022-09-07 LAB — MAGNESIUM: Magnesium: 1.8 mg/dL (ref 1.7–2.4)

## 2022-09-07 MED ORDER — SUCRALFATE 1 G PO TABS: 1.0000 g | ORAL_TABLET | Freq: Three times a day (TID) | ORAL | 0 refills | Status: DC

## 2022-09-07 MED ORDER — BUTALBITAL-APAP-CAFFEINE 50-325-40 MG PO TABS: 1.0000 | ORAL_TABLET | Freq: Four times a day (QID) | ORAL | 0 refills | Status: DC | PRN

## 2022-09-07 MED ORDER — HYDROCODONE BIT-HOMATROP MBR 5-1.5 MG/5ML PO SOLN
5.0000 mL | Freq: Four times a day (QID) | ORAL | Status: DC | PRN
  Filled 2022-09-07: qty 5

## 2022-09-07 MED ORDER — PANTOPRAZOLE SODIUM 40 MG PO TBEC: 40.0000 mg | DELAYED_RELEASE_TABLET | Freq: Two times a day (BID) | ORAL | 1 refills | Status: DC

## 2022-09-07 MED ORDER — POTASSIUM CHLORIDE 20 MEQ PO PACK
60.0000 meq | PACK | Freq: Once | ORAL | Status: AC
  Administered 2022-09-07: 60 meq via ORAL
  Filled 2022-09-07: qty 3

## 2022-09-07 MED ORDER — AMLODIPINE BESYLATE 5 MG PO TABS: 5.0000 mg | ORAL_TABLET | Freq: Every day | ORAL | 0 refills | Status: DC

## 2022-09-07 NOTE — Anesthesia Postprocedure Evaluation (Signed)
Anesthesia Post Note  Patient: Patrick Wood  Procedure(s) Performed: ESOPHAGOGASTRODUODENOSCOPY (EGD) WITH PROPOFOL BIOPSY  Patient location during evaluation: Phase II Anesthesia Type: General Level of consciousness: awake Pain management: pain level controlled Vital Signs Assessment: post-procedure vital signs reviewed and stable Respiratory status: spontaneous breathing and respiratory function stable Cardiovascular status: blood pressure returned to baseline and stable Postop Assessment: no headache and no apparent nausea or vomiting Anesthetic complications: no Comments: Late entry   No notable events documented.   Last Vitals:  Vitals:   09/06/22 1900 09/07/22 0419  BP: (!) 166/94 (!) 149/84  Pulse: 81 72  Resp: 18 18  Temp: 36.8 C 36.8 C  SpO2: 96% 97%    Last Pain:  Vitals:   09/07/22 0927  TempSrc:   PainSc: [place]

## 2022-09-07 NOTE — Progress Notes (Addendum)
Gastroenterology Progress Note   Referring Provider: No ref. provider found Primary Care Physician:  Dulce Sellar, NP Primary Gastroenterologist:  Dr. Milbert [facility]ter [facility]  Patient ID: Patrick Wood; [id number]; 1961/08/22   Subjective:    Woke up with a cough this morning. States he has had cough off/on and at home he uses cough drops. Tolerating clear liquids, does not really prefer to advance diet. He feels like broth is "safe", does not like hospital food.   Objective:   Vital signs in last 24 hours: Temp:  [98.1 F (36.7 C)-98.4 F (36.9 C)] 98.2 F (36.8 C) (10/19 0419) Pulse Rate:  [72-81] 72 (10/19 0419) Resp:  [14-19] 18 (10/19 0419) BP: (140-167)/(84-100) 149/84 (10/19 0419) SpO2:  [96 %-99 %] 97 % (10/19 0419) Weight:  [86.6 kg] 86.6 kg (10/18 1313) Last BM Date : 09/04/22 General:   Alert,  Well-developed, well-nourished, pleasant and cooperative in NAD Head:  Normocephalic and atraumatic. Eyes:  Sclera clear, no icterus.  Chest: CTA bilaterally without rales, rhonchi, crackles.    Heart:  Regular rate and rhythm; no murmurs, clicks, rubs,  or gallops. Abdomen:  Soft, nontender and nondistended.  Normal bowel sounds, without guarding, and without rebound.   Extremities:  Without clubbing, deformity or edema. Neurologic:  Alert and  oriented x4;  grossly normal neurologically. Psych:  Alert and cooperative. Normal mood and affect.  Intake/Output from previous day: 10/18 0701 - 10/19 0700 In: 2101.8 [P.O.:360; I.V.:1741.8] Out: -  Intake/Output this shift: No intake/output data recorded.  Lab Results: CBC Recent Labs    09/05/22 1220 09/06/22 0522 09/07/22 0523  WBC 12.4* 8.3 6.9  HGB 14.8 12.1* 11.7*  HCT 45.1 36.3* 35.0*  MCV 90.9 89.4 89.3  PLT 306 331 291   BMET Recent Labs    09/05/22 1220 09/06/22 0522 09/07/22 0523  NA 141 139 139  K 4.3 3.5 3.0*  CL 99 105 105  CO2 29 26 25   GLUCOSE 123* 124* 99  BUN 27* 23 17  CREATININE 1.41*  1.01 0.85  CALCIUM 12.2* 9.1 8.7*   LFTs Recent Labs    09/05/22 1220  BILITOT 0.6  ALKPHOS 61  AST 14*  ALT 15  PROT 8.7*  ALBUMIN 4.1   No results for input(s): "LIPASE" in the last 72 hours. PT/INR No results for input(s): "LABPROT", "INR" in the last 72 hours.       Imaging Studies: CT CHEST W CONTRAST  Result Date: 09/06/2022 CLINICAL DATA:  A 61 year old male presents with "esophageal mass" and vomiting. * Tracking Code: BO * EXAM: CT CHEST WITH CONTRAST TECHNIQUE: Multidetector CT imaging of the chest was performed during intravenous contrast administration. RADIATION DOSE REDUCTION: This exam was performed according to the departmental dose-optimization program which includes automated exposure control, adjustment of the mA and/or kV according to patient size and/or use of iterative reconstruction technique. CONTRAST:  39mL OMNIPAQUE IOHEXOL 300 MG/ML  SOLN COMPARISON:  Abdominal imaging from September 05, 2022 FINDINGS: Cardiovascular: Normal heart size. Three-vessel coronary artery disease greatest in LEFT anterior descending coronary artery. Normal caliber of central pulmonary vasculature and of the thoracic aorta. Mediastinum/Nodes: No adenopathy in the chest. Thickening of an stranding about the distal esophagus just above the GE junction. Thickening is quite similar to the recent abdominal CT. Mild surrounding stranding also similar to slightly increased presumably related to recent biopsy. No extraluminal gas in the posterior mediastinum. Small hiatal hernia. Lungs/Pleura: Airways are patent. No consolidation or pleural effusion. No suspicious  mass or pulmonary nodule. Upper Abdomen: Heterogeneous appearance of the liver likely related to phase of contrast enhancement. Liver appeared normal on previous venous phase imaging. Imaged portions of the pancreas, spleen and adrenal glands are unremarkable. Small hiatal hernia associated with distal esophageal abnormalities as discussed.  Musculoskeletal: No acute bone finding. No destructive bone process. Spinal degenerative changes. IMPRESSION: 1. Thickening of an stranding about the distal esophagus just above the GE junction. Thickening is quite similar to the recent abdominal CT. Mild surrounding stranding also similar to slightly increased presumably related to recent biopsy. No extraluminal gas in the posterior mediastinum. Correlate with recent pathology results. Based on CT appearance underlying neoplasm is not excluded. Marked esophagitis could potentially have a similar appearance. 2. No signs of adenopathy in the chest 3. Small hiatal hernia. 4. Three-vessel coronary artery disease greatest in LEFT anterior descending coronary artery. Electronically Signed   By: Zetta Bills M.D.   On: 09/06/2022 16:59   CT ABDOMEN PELVIS W CONTRAST  Result Date: 09/05/2022 CLINICAL DATA:  Unintentional weight loss and loss of appetite. EXAM: CT ABDOMEN AND PELVIS WITH CONTRAST TECHNIQUE: Multidetector CT imaging of the abdomen and pelvis was performed using the standard protocol following bolus administration of intravenous contrast. RADIATION DOSE REDUCTION: This exam was performed according to the departmental dose-optimization program which includes automated exposure control, adjustment of the mA and/or kV according to patient size and/or use of iterative reconstruction technique. CONTRAST:  14mL OMNIPAQUE IOHEXOL 300 MG/ML  SOLN COMPARISON:  CT scan from 2019 FINDINGS: Lower chest: Marked thickening of the distal esophageal wall highly suspicious for esophageal cancer. Definite change since the prior CT scan. Recommend GI consultation and endoscopic evaluation. No paraesophageal lymphadenopathy. The heart is normal in size. No pericardial effusion. Age advanced coronary artery calcifications are noted. The lungs are clear of an acute process. No obvious pulmonary lesions. Exam is somewhat degraded by breathing motion artifact. No pleural  effusions or pleural lesions. Hepatobiliary: No hepatic lesions to suggest hepatic metastatic disease. 7 mm gastrohepatic ligament lymph node is indeterminate. No celiac axis or periportal adenopathy. No intrahepatic biliary dilatation. The gallbladder is unremarkable. No common bile duct dilatation. Pancreas: No mass, inflammation or ductal dilatation. Small duodenal diverticulum noted near the pancreatic head. Spleen: Normal size.  No focal lesions. Adrenals/Urinary Tract: Adrenal glands are normal. No renal lesions or hydronephrosis. The bladder is unremarkable. Stomach/Bowel: The stomach, duodenum, small and colon are unremarkable. No acute inflammatory process, mass lesions or obstructive findings. The terminal ileum and appendix are normal. Descending and sigmoid colon diverticulosis without findings for acute diverticulitis. Vascular/Lymphatic: Significant age advanced atherosclerotic calcifications involving the aorta and iliac arteries. No aneurysm dissection. The major venous structures are patent. No mesenteric or retroperitoneal mass or adenopathy. Reproductive: Enlarged prostate gland with median lobe hypertrophy the impressing on the base of the bladder. The seminal vesicles are unremarkable. Other: No pelvic mass or adenopathy. No free pelvic fluid collections. No inguinal mass or adenopathy. No abdominal wall hernia or subcutaneous lesions. Musculoskeletal: No significant bony findings. No worrisome bone lesions. IMPRESSION: 1. Marked thickening of the distal esophageal wall highly suspicious for esophageal cancer. Recommend GI consultation and endoscopic evaluation. 2. 7 mm gastrohepatic ligament lymph node is indeterminate. No paraesophageal, celiac axis or periportal adenopathy. 3. No findings for hepatic metastatic disease. 4. Age advanced atherosclerotic calcifications involving the coronary arteries, abdominal aorta and iliac arteries. 5. Enlarged prostate gland with median lobe hypertrophy  impressing on the base of the bladder. 6. Aortic  atherosclerosis. Aortic Atherosclerosis (ICD10-I70.0). Electronically Signed   By: Marijo Sanes M.D.   On: 09/05/2022 15:35   CT Head Wo Contrast  Result Date: 09/05/2022 CLINICAL DATA:  Headache EXAM: CT HEAD WITHOUT CONTRAST TECHNIQUE: Contiguous axial images were obtained from the base of the skull through the vertex without intravenous contrast. RADIATION DOSE REDUCTION: This exam was performed according to the departmental dose-optimization program which includes automated exposure control, adjustment of the mA and/or kV according to patient size and/or use of iterative reconstruction technique. COMPARISON:  06/15/2018 FINDINGS: Brain: No evidence of acute infarction, hemorrhage, hydrocephalus, extra-axial collection or mass lesion/mass effect. Similar scattered low-density changes within the periventricular and subcortical white matter most compatible with chronic microvascular ischemic change. Vascular: No hyperdense vessel or unexpected calcification. Skull: Normal. Negative for fracture or focal lesion. Sinuses/Orbits: No acute finding. Other: None. IMPRESSION: 1. No acute intracranial findings. 2. Chronic microvascular ischemic change of the white matter. Electronically Signed   By: Davina Poke D.O.   On: 09/05/2022 12:41  [2 weeks]  Assessment:   JAXXON DATEMA is a 61 y.o. year old male with pmh GERD, [place], HTN, Chronic Hep C s/p treatment, who presented to the ED with a temporal headache x2 months, with c/o nausea and vomiting with 50 pound weight loss over the past 3 months. CT A/P with esophageal abnormalities concerning for malignancy, GI consulted for further evaluation.   Nausea/vomiting/abnormal CT of esophagus: reported nausea and vomiting with 30 pounds unintentional weight loss over the past few months. Difficulty keeping solid foods down and sometimes liquids. Taking excedrin migraine almost daily during that time for  ongoing headache, though GI symptoms began prior to this. History of GERD and no previous EGD for Barrett's esophagus screening as patient declined. No overt dysphagia but does cut meats and thicker foods to avoid choking. Not currently on PPI as outpatient.CT abdomen/pelvis with marked thickening of distal esophageal wall.  CT chest yesterday with similar thickening about the distal esophagus just above the GE junction seen on prior CT.  Mild surrounding stranding similar to slightly increased.  No adenopathy in the chest. Woke up this morning with a nagging cough. States he has had this off/on chronically and at home he keeps cough drops available.    EGD yesterday: Esophageal mucosal changes suggestive of long segment Barrett's esophagus, esophageal ulcer with no recent bleeding, associated nodularity suspicious for malignancy status post biopsy, 4 cm hiatal hernia, nonbleeding gastric ulcers with clean base status post biopsy, nonbleeding duodenal ulcer with clean base.    Plan:   Follow-up pathology. Continue PPI BID. Carafate 1 gram qac, qhs for 14 days. Hycodan syrup as needed. OK for discharge from a GI standpoint. He will follow up with Dr. Lance Sell GI.   LOS: 0 days   Laureen Ochs. Bernarda Caffey [facility] [phone] 10/19/20238:53 AM

## 2022-09-07 NOTE — Discharge Summary (Signed)
Physician Discharge Summary   Patient: Patrick Wood MRN: [id number] DOB: 1961-08-30  Admit date:     09/05/2022  Discharge date: 09/07/22  Discharge Physician: Lynden Oxford  PCP: Dulce Sellar, NP  Recommendations at discharge: Follow-up with PCP in 1 week. Follow-up with GI as recommended. Establish care with neurology for headache therapy.   Follow-up Information     Dulce Sellar, NP. Schedule an appointment as soon as possible for a visit in 2 week(s).   Specialty: Family Medicine Contact information: [address] [place] [address] [phone]         [facility]. Schedule an appointment as soon as possible for a visit in 2 month(s).   Specialty: Gastroenterology Contact information: [address] [place] [address] [phone]               Discharge Diagnoses: Principal Problem:   Intractable vomiting Active Problems:   Dysphagia   Essential hypertension   Persistent headaches   Hypercalcemia   Abnormal CT scan, esophagus  Hospital Course: PMH of HTN, hep C, planter fasciitis, prostate cancer, CVA, migraine history taking daily NSAIDs.  Present to the hospital with complaints of nausea and vomiting.  Found to have multiple esophageal and gastric ulcer without any active bleeding. Assessment and Plan  Intractable vomiting Multiple esophageal and gastric ulcer as well as duodenal ulcer Intractable vomiting with dysphagia. CT abdomen and pelvis- Marked thickening of the distal esophageal wall highly suspicious for esophageal cancer. GI consulted. Underwent EGD.  1 cratered esophageal ulcer with nodularity raising suspicion for malignancy.  Biopsy performed. 5 nonbleeding gastric ulcer with clean ulcer base and 1 nonbleeding duodenal ulcer. GI recommended initiate patient on IV PPI and Carafate. Patient able to tolerate diet.  Patient will be discharged home on oral PPI and Carafate  regimen. Patient will follow-up with [facility]. Recommended patient to avoid NSAIDs.   Migraine headache. Patient uses multiple Excedrin during the day for migraine headache. Has not seen a headache specialist. Does not take any medication for migraine prophylaxis. We will initiate Fioricet and recommend outpatient follow-up with neurology to establish care for migraine therapy.   Hypercalcemia Continue IV hydration.   Essential hypertension Blood pressure elevated. Continue current regimen.   Coronary atherosclerosis. Incidentally seen on CT abdomen and chest. Patient does not have any symptoms of ACS or angina for now. No work-up recommended for now. But prior to considering migraine therapy like Imitrex, patient may require further work-up.  Pain control - [employer] Controlled Substance Reporting System database was reviewed. and patient was instructed, not to drive, operate heavy machinery, perform activities at heights, swimming or participation in water activities or provide baby-sitting services while on Pain, Sleep and Anxiety Medications; until their outpatient Physician has advised to do so again. Also recommended to not to take more than prescribed Pain, Sleep and Anxiety Medications.  Consultants:  Gastroenterology   Procedures performed:  Upper GI endoscopy  DISCHARGE MEDICATION: Allergies as of 09/07/2022       Reactions   Nsaids Other (See Comments)   Recommend to avoid the medication due to peptic ulcers.         Medication List     STOP taking these medications    Advil 200 MG tablet Generic drug: ibuprofen   aspirin-acetaminophen-caffeine 250-250-65 MG tablet Commonly known as: EXCEDRIN MIGRAINE   diazepam 5 MG tablet Commonly known as: VALIUM   naproxen 500 MG tablet Commonly known as: NAPROSYN  TAKE these medications    amLODipine 5 MG tablet Commonly known as: NORVASC Take 1 tablet (5 mg total) by mouth daily. Start  taking on: September 08, 2022   butalbital-acetaminophen-caffeine 50-325-40 MG tablet Commonly known as: FIORICET Take 1 tablet by mouth every 6 (six) hours as needed for headache or migraine.   cyclobenzaprine 10 MG tablet Commonly known as: FLEXERIL Take 1-2 tablets (10-20 mg total) by mouth 3 (three) times daily as needed for muscle spasms. Take 1 tab bid prn, 1-2 tabs qhs prn   olmesartan 20 MG tablet Commonly known as: BENICAR Take 1 tablet (20 mg total) by mouth daily.   pantoprazole 40 MG tablet Commonly known as: Protonix Take 1 tablet (40 mg total) by mouth 2 (two) times daily before a meal.   sucralfate 1 g tablet Commonly known as: Carafate Take 1 tablet (1 g total) by mouth 4 (four) times daily -  with meals and at bedtime.   TYLENOL 500 MG tablet Generic drug: acetaminophen Take 1,500 mg by mouth every 6 (six) hours as needed.       Disposition: Home Diet recommendation: Regular diet  Discharge Exam: Vitals:   09/06/22 1337 09/06/22 1418 09/06/22 1900 09/07/22 0419  BP: (!) 167/94 (!) 140/100 (!) 166/94 (!) 149/84  Pulse:  78 81 72  Resp:  19 18 18   Temp:  98.3 F (36.8 C) 98.3 F (36.8 C) 98.2 F (36.8 C)  TempSrc:   Oral   SpO2:  96% 96% 97%  Weight:      Height:       General: Appear in mild distress; no visible Abnormal Neck Mass Or lumps, Conjunctiva normal Cardiovascular: S1 and S2 Present, no Murmur, Respiratory: good respiratory effort, Bilateral Air entry present and CTA, no Crackles, no wheezes Abdomen: Bowel Sound present, Non tender  Extremities: no Pedal edema Neurology: alert and oriented to time, place, and person  Filed Weights   09/05/22 1505 09/05/22 1818 09/06/22 1313  Weight: 91.2 kg 86.7 kg 86.6 kg   Condition at discharge: stable  The results of significant diagnostics from this hospitalization (including imaging, microbiology, ancillary and laboratory) are listed below for reference.   Imaging Studies: CT CHEST W  CONTRAST  Result Date: 09/06/2022 CLINICAL DATA:  A 61 year old male presents with "esophageal mass" and vomiting. * Tracking Code: BO * EXAM: CT CHEST WITH CONTRAST TECHNIQUE: Multidetector CT imaging of the chest was performed during intravenous contrast administration. RADIATION DOSE REDUCTION: This exam was performed according to the departmental dose-optimization program which includes automated exposure control, adjustment of the mA and/or kV according to patient size and/or use of iterative reconstruction technique. CONTRAST:  92mL OMNIPAQUE IOHEXOL 300 MG/ML  SOLN COMPARISON:  Abdominal imaging from September 05, 2022 FINDINGS: Cardiovascular: Normal heart size. Three-vessel coronary artery disease greatest in LEFT anterior descending coronary artery. Normal caliber of central pulmonary vasculature and of the thoracic aorta. Mediastinum/Nodes: No adenopathy in the chest. Thickening of an stranding about the distal esophagus just above the GE junction. Thickening is quite similar to the recent abdominal CT. Mild surrounding stranding also similar to slightly increased presumably related to recent biopsy. No extraluminal gas in the posterior mediastinum. Small hiatal hernia. Lungs/Pleura: Airways are patent. No consolidation or pleural effusion. No suspicious mass or pulmonary nodule. Upper Abdomen: Heterogeneous appearance of the liver likely related to phase of contrast enhancement. Liver appeared normal on previous venous phase imaging. Imaged portions of the pancreas, spleen and adrenal glands are unremarkable. Small hiatal  hernia associated with distal esophageal abnormalities as discussed. Musculoskeletal: No acute bone finding. No destructive bone process. Spinal degenerative changes. IMPRESSION: 1. Thickening of an stranding about the distal esophagus just above the GE junction. Thickening is quite similar to the recent abdominal CT. Mild surrounding stranding also similar to slightly increased  presumably related to recent biopsy. No extraluminal gas in the posterior mediastinum. Correlate with recent pathology results. Based on CT appearance underlying neoplasm is not excluded. Marked esophagitis could potentially have a similar appearance. 2. No signs of adenopathy in the chest 3. Small hiatal hernia. 4. Three-vessel coronary artery disease greatest in LEFT anterior descending coronary artery. Electronically Signed   By: Donzetta Kohut M.D.   On: 09/06/2022 16:59   CT ABDOMEN PELVIS W CONTRAST  Result Date: 09/05/2022 CLINICAL DATA:  Unintentional weight loss and loss of appetite. EXAM: CT ABDOMEN AND PELVIS WITH CONTRAST TECHNIQUE: Multidetector CT imaging of the abdomen and pelvis was performed using the standard protocol following bolus administration of intravenous contrast. RADIATION DOSE REDUCTION: This exam was performed according to the departmental dose-optimization program which includes automated exposure control, adjustment of the mA and/or kV according to patient size and/or use of iterative reconstruction technique. CONTRAST:  OMNIPAQUE IOHEXOL 300 MG/ML  SOLN COMPARISON:  CT scan from 2019 FINDINGS: Lower chest: Marked thickening of the distal esophageal wall highly suspicious for esophageal cancer. Definite change since the prior CT scan. Recommend GI consultation and endoscopic evaluation. No paraesophageal lymphadenopathy. The heart is normal in size. No pericardial effusion. Age advanced coronary artery calcifications are noted. The lungs are clear of an acute process. No obvious pulmonary lesions. Exam is somewhat degraded by breathing motion artifact. No pleural effusions or pleural lesions. Hepatobiliary: No hepatic lesions to suggest hepatic metastatic disease. 7 mm gastrohepatic ligament lymph node is indeterminate. No celiac axis or periportal adenopathy. No intrahepatic biliary dilatation. The gallbladder is unremarkable. No common bile duct dilatation. Pancreas: No  mass, inflammation or ductal dilatation. Small duodenal diverticulum noted near the pancreatic head. Spleen: Normal size.  No focal lesions. Adrenals/Urinary Tract: Adrenal glands are normal. No renal lesions or hydronephrosis. The bladder is unremarkable. Stomach/Bowel: The stomach, duodenum, small and colon are unremarkable. No acute inflammatory process, mass lesions or obstructive findings. The terminal ileum and appendix are normal. Descending and sigmoid colon diverticulosis without findings for acute diverticulitis. Vascular/Lymphatic: Significant age advanced atherosclerotic calcifications involving the aorta and iliac arteries. No aneurysm dissection. The major venous structures are patent. No mesenteric or retroperitoneal mass or adenopathy. Reproductive: Enlarged prostate gland with median lobe hypertrophy the impressing on the base of the bladder. The seminal vesicles are unremarkable. Other: No pelvic mass or adenopathy. No free pelvic fluid collections. No inguinal mass or adenopathy. No abdominal wall hernia or subcutaneous lesions. Musculoskeletal: No significant bony findings. No worrisome bone lesions. IMPRESSION: 1. Marked thickening of the distal esophageal wall highly suspicious for esophageal cancer. Recommend GI consultation and endoscopic evaluation. 2. 7 mm gastrohepatic ligament lymph node is indeterminate. No paraesophageal, celiac axis or periportal adenopathy. 3. No findings for hepatic metastatic disease. 4. Age advanced atherosclerotic calcifications involving the coronary arteries, abdominal aorta and iliac arteries. 5. Enlarged prostate gland with median lobe hypertrophy impressing on the base of the bladder. 6. Aortic atherosclerosis. Aortic Atherosclerosis (ICD10-I70.0). Electronically Signed   By: Rudie Meyer M.D.   On: 09/05/2022 15:35   CT Head Wo Contrast  Result Date: 09/05/2022 CLINICAL DATA:  Headache EXAM: CT HEAD WITHOUT CONTRAST TECHNIQUE: Contiguous axial  images  were obtained from the base of the skull through the vertex without intravenous contrast. RADIATION DOSE REDUCTION: This exam was performed according to the departmental dose-optimization program which includes automated exposure control, adjustment of the mA and/or kV according to patient size and/or use of iterative reconstruction technique. COMPARISON:  06/15/2018 FINDINGS: Brain: No evidence of acute infarction, hemorrhage, hydrocephalus, extra-axial collection or mass lesion/mass effect. Similar scattered low-density changes within the periventricular and subcortical white matter most compatible with chronic microvascular ischemic change. Vascular: No hyperdense vessel or unexpected calcification. Skull: Normal. Negative for fracture or focal lesion. Sinuses/Orbits: No acute finding. Other: None. IMPRESSION: 1. No acute intracranial findings. 2. Chronic microvascular ischemic change of the white matter. Electronically Signed   By: Davina Poke D.O.   On: 09/05/2022 12:41    Microbiology: Results for orders placed or performed in visit on 08/19/18  Urine Culture     Status: None   Collection Time: 08/19/18  9:47 AM   Specimen: Urine  Result Value Ref Range Status   MICRO NUMBER: [id number]  Final   SPECIMEN QUALITY: ADEQUATE  Final   Sample Source URINE  Final   STATUS: FINAL  Final   Result: No Growth  Final   Labs: CBC: Recent Labs  Lab 09/05/22 1220 09/06/22 0522 09/07/22 0523  WBC 12.4* 8.3 6.9  HGB 14.8 12.1* 11.7*  HCT 45.1 36.3* 35.0*  MCV 90.9 89.4 89.3  PLT 306 331 270   Basic Metabolic Panel: Recent Labs  Lab 09/05/22 1220 09/06/22 0522 09/07/22 0523  NA 141 139 139  K 4.3 3.5 3.0*  CL 99 105 105  CO2 29 26 25   GLUCOSE 123* 124* 99  BUN 27* 23 17  CREATININE 1.41* 1.01 0.85  CALCIUM 12.2* 9.1 8.7*  MG 2.1  --  1.8   Liver Function Tests: Recent Labs  Lab 09/05/22 1220  AST 14*  ALT 15  ALKPHOS 61  BILITOT 0.6  PROT 8.7*  ALBUMIN 4.1   CBG: No  results for input(s): "GLUCAP" in the last 168 hours.  Discharge time spent: greater than 30 minutes.  Signed: Berle Mull, MD [facility] 09/07/2022

## 2022-09-07 NOTE — Progress Notes (Signed)
No compliants of nausea or vomiting , only a headache and receiving either tylenol or fioricet.  IV removed and discharge instructions reviewed.  Scripts sent to pharmacy.  To follow up with gastro and primary md.  Walked to main entrance and wife to drive home

## 2022-09-14 ENCOUNTER — Encounter: Payer: Self-pay | Admitting: Internal Medicine

## 2022-09-14 ENCOUNTER — Ambulatory Visit: Payer: 59 | Admitting: Internal Medicine

## 2022-09-14 VITALS — BP 138/80 | HR 89 | Ht 69.0 in | Wt 189.8 lb

## 2022-09-14 DIAGNOSIS — K259 Gastric ulcer, unspecified as acute or chronic, without hemorrhage or perforation: Secondary | ICD-10-CM | POA: Diagnosis not present

## 2022-09-14 DIAGNOSIS — Z0001 Encounter for general adult medical examination with abnormal findings: Secondary | ICD-10-CM

## 2022-09-14 DIAGNOSIS — R1310 Dysphagia, unspecified: Secondary | ICD-10-CM | POA: Diagnosis not present

## 2022-09-14 DIAGNOSIS — K219 Gastro-esophageal reflux disease without esophagitis: Secondary | ICD-10-CM | POA: Diagnosis not present

## 2022-09-14 DIAGNOSIS — Z7689 Persons encountering health services in other specified circumstances: Secondary | ICD-10-CM

## 2022-09-14 DIAGNOSIS — E876 Hypokalemia: Secondary | ICD-10-CM | POA: Diagnosis not present

## 2022-09-14 DIAGNOSIS — I10 Essential (primary) hypertension: Secondary | ICD-10-CM | POA: Diagnosis not present

## 2022-09-14 DIAGNOSIS — R519 Headache, unspecified: Secondary | ICD-10-CM

## 2022-09-14 MED ORDER — PANTOPRAZOLE SODIUM 40 MG PO TBEC: 40.0000 mg | DELAYED_RELEASE_TABLET | Freq: Two times a day (BID) | ORAL | 1 refills | Status: DC
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
## 2022-09-14 NOTE — Patient Instructions (Signed)
It was a pleasure to see you today.  Thank you for giving [place] the opportunity to be involved in your care.  Below is a brief recap of your visit and next steps.  We will plan to see you again in 4 weeks.  Summary You have established care today We will check labs I have placed a new ordered for protonix Follow up in 4 weeks

## 2022-09-15 LAB — SURGICAL PATHOLOGY

## 2022-09-16 LAB — CBC WITH DIFFERENTIAL/PLATELET
Basophils Absolute: 0.1 10*3/uL (ref 0.0–0.2)
Basos: 1 %
EOS (ABSOLUTE): 0.4 10*3/uL (ref 0.0–0.4)
Eos: 5 %
Hematocrit: 37.5 % (ref 37.5–51.0)
Hemoglobin: 12.7 g/dL — ABNORMAL LOW (ref 13.0–17.7)
Immature Grans (Abs): 0 10*3/uL (ref 0.0–0.1)
Immature Granulocytes: 0 %
Lymphocytes Absolute: 1.7 10*3/uL (ref 0.7–3.1)
Lymphs: 26 %
MCH: 30.1 pg (ref 26.6–33.0)
MCHC: 33.9 g/dL (ref 31.5–35.7)
MCV: 89 fL (ref 79–97)
Monocytes Absolute: 0.5 10*3/uL (ref 0.1–0.9)
Monocytes: 8 %
Neutrophils Absolute: 3.9 10*3/uL (ref 1.4–7.0)
Neutrophils: 60 %
Platelets: 347 10*3/uL (ref 150–450)
RBC: 4.22 x10E6/uL (ref 4.14–5.80)
RDW: 13.9 % (ref 11.6–15.4)
WBC: 6.6 10*3/uL (ref 3.4–10.8)

## 2022-09-16 LAB — HEMOGLOBIN A1C
Est. average glucose Bld gHb Est-mCnc: 120 mg/dL
Hgb A1c MFr Bld: 5.8 % — ABNORMAL HIGH (ref 4.8–5.6)

## 2022-09-16 LAB — LIPID PANEL
Chol/HDL Ratio: 6.8 ratio — ABNORMAL HIGH (ref 0.0–5.0)
Cholesterol, Total: 232 mg/dL — ABNORMAL HIGH (ref 100–199)
HDL: 34 mg/dL — ABNORMAL LOW (ref >39)
LDL Chol Calc (NIH): 97 mg/dL (ref 0–99)
Triglycerides: 602 mg/dL (ref 0–149)
VLDL Cholesterol Cal: 101 mg/dL — ABNORMAL HIGH (ref 5–40)

## 2022-09-16 LAB — CMP14+EGFR
ALT: 17 IU/L (ref 0–44)
AST: 8 IU/L (ref 0–40)
Albumin/Globulin Ratio: 2.1 (ref 1.2–2.2)
Albumin: 4.6 g/dL (ref 3.9–4.9)
Alkaline Phosphatase: 61 IU/L (ref 44–121)
BUN/Creatinine Ratio: 16 (ref 10–24)
BUN: 17 mg/dL (ref 8–27)
Bilirubin Total: 0.2 mg/dL (ref 0.0–1.2)
CO2: 21 mmol/L (ref 20–29)
Calcium: 9.6 mg/dL (ref 8.6–10.2)
Chloride: 104 mmol/L (ref 96–106)
Creatinine, Ser: 1.08 mg/dL (ref 0.76–1.27)
Globulin, Total: 2.2 g/dL (ref 1.5–4.5)
Glucose: 97 mg/dL (ref 70–99)
Potassium: 4.4 mmol/L (ref 3.5–5.2)
Sodium: 144 mmol/L (ref 134–144)
Total Protein: 6.8 g/dL (ref 6.0–8.5)
eGFR: 78 mL/min/{1.73_m2} (ref >59)

## 2022-09-16 LAB — TSH+FREE T4
Free T4: 1.07 ng/dL (ref 0.82–1.77)
TSH: 2.14 u[IU]/mL (ref 0.450–4.500)

## 2022-09-16 LAB — B12 AND FOLATE PANEL
Folate: 5.2 ng/mL (ref >3.0)
Vitamin B-12: 852 pg/mL (ref 232–1245)

## 2022-09-16 LAB — VITAMIN D 25 HYDROXY (VIT D DEFICIENCY, FRACTURES): Vit D, 25-Hydroxy: 21.9 ng/mL — ABNORMAL LOW (ref 30.0–100.0)

## 2022-09-17 ENCOUNTER — Encounter: Payer: Self-pay | Admitting: Internal Medicine

## 2022-09-18 ENCOUNTER — Encounter (HOSPITAL_COMMUNITY): Payer: Self-pay | Admitting: Gastroenterology

## 2022-09-20 DIAGNOSIS — K259 Gastric ulcer, unspecified as acute or chronic, without hemorrhage or perforation: Secondary | ICD-10-CM | POA: Insufficient documentation

## 2022-09-20 DIAGNOSIS — Z0001 Encounter for general adult medical examination with abnormal findings: Secondary | ICD-10-CM | POA: Insufficient documentation

## 2022-09-20 HISTORY — DX: Encounter for general adult medical examination with abnormal findings: Z00.01

## 2022-09-20 NOTE — Assessment & Plan Note (Signed)
Recent hospital admission for intractable nausea and vomiting.  EGD during admission demonstrated multiple gastric ulcers.  Pathology report and GI follow-up are pending.  He is currently prescribed pantoprazole twice daily and Carafate x4 weeks.  He requests a new prescription for pantoprazole today and reports that his pharmacy ([facility] [place]) will not fill his current prescription. -New prescription for pantoprazole placed today

## 2022-09-20 NOTE — Assessment & Plan Note (Addendum)
BP 130/80 today.  He is currently prescribed amlodipine 5 mg daily and olmesartan 20 mg daily.  No medication changes today.  Follow-up in 4 weeks for reassessment.

## 2022-09-20 NOTE — Assessment & Plan Note (Signed)
Presenting today to establish care.  Recent medical records and labs reviewed. -Labs ordered today -Outstanding vaccines were declined

## 2022-09-20 NOTE — Assessment & Plan Note (Signed)
Recent headache during hospital admission that resolved with headache cocktail.  He reports a history of migraines, but states that aside from his recent admission he has not had any headaches.  He was prescribed Fioricet and Tylenol for as needed use.

## 2022-10-19 ENCOUNTER — Encounter: Payer: Self-pay | Admitting: Internal Medicine

## 2022-10-19 ENCOUNTER — Ambulatory Visit: Payer: 59 | Admitting: Internal Medicine

## 2022-10-19 ENCOUNTER — Encounter: Payer: Self-pay | Admitting: Gastroenterology

## 2022-10-19 VITALS — BP 123/80 | HR 84 | Ht 69.0 in | Wt 201.4 lb

## 2022-10-19 DIAGNOSIS — R7303 Prediabetes: Secondary | ICD-10-CM | POA: Diagnosis not present

## 2022-10-19 DIAGNOSIS — I10 Essential (primary) hypertension: Secondary | ICD-10-CM

## 2022-10-19 DIAGNOSIS — Z1212 Encounter for screening for malignant neoplasm of rectum: Secondary | ICD-10-CM

## 2022-10-19 DIAGNOSIS — Z1211 Encounter for screening for malignant neoplasm of colon: Secondary | ICD-10-CM | POA: Diagnosis not present

## 2022-10-19 DIAGNOSIS — E785 Hyperlipidemia, unspecified: Secondary | ICD-10-CM

## 2022-10-19 DIAGNOSIS — K259 Gastric ulcer, unspecified as acute or chronic, without hemorrhage or perforation: Secondary | ICD-10-CM

## 2022-10-19 HISTORY — DX: Hyperlipidemia, unspecified: E78.5

## 2022-10-19 MED ORDER — SUCRALFATE 1 G PO TABS: 1.0000 g | ORAL_TABLET | Freq: Three times a day (TID) | ORAL | 0 refills | Status: DC
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
## 2022-10-19 NOTE — Assessment & Plan Note (Addendum)
Cholesterol panel updated last month.  His total cholesterol is 232 and LDL 97.  TGs were significantly elevated at 602.  His 10-year ASCVD risk score is 14.9%.  Through shared decision making, he would like to make lifestyle modifications, namely dietary changes, and improving his cholesterol before starting medication.  We will follow-up in 3 months repeat cholesterol panel at that time.  If there is no significant improvement, he is in agreement with starting medication.

## 2022-10-19 NOTE — Assessment & Plan Note (Signed)
He has never had a colonoscopy or undergone colorectal cancer screening.  GI referral placed today.

## 2022-10-19 NOTE — Patient Instructions (Signed)
It was a pleasure to see you today.  Thank you for giving [place] the opportunity to be involved in your care.  Below is a brief recap of your visit and next steps.  We will plan to see you again in 3 months.  Summary Restart carafate today I have placed a referral to GI I recommend making dietary changes to lower your cholesterol as we discussed. Follow up in 3 months

## 2022-10-19 NOTE — Assessment & Plan Note (Signed)
To 5.8 on labs from last month.  We reviewed lifestyle modifications, namely diet and exercise, and preventing progression to diabetes.

## 2022-10-19 NOTE — Assessment & Plan Note (Signed)
He is currently prescribed amlodipine 5 mg daily and olmesartan 20 mg daily.  His blood pressure today is 123/80.  No medication changes today.

## 2022-10-19 NOTE — Assessment & Plan Note (Signed)
He continues to take Protonix 40 mg twice daily pleated Carafate.  Since completing Carafate he has experienced bilateral jaw discomfort, which she was previously told could be caused by gastric ulcers.  He is concerned about this and is interested in restarting Carafate.  Pathology from his recent EGD with biopsy showed reactive gastritis without concern for malignancy.  He has not been seen by GI for follow-up. -Continue Protonix.  Carafate has been refilled for now. -I have placed a referral to GI for follow-up and for screening colonoscopy

## 2022-11-05 ENCOUNTER — Other Ambulatory Visit: Payer: Self-pay | Admitting: Family

## 2022-11-05 DIAGNOSIS — I10 Essential (primary) hypertension: Secondary | ICD-10-CM

## 2022-11-15 ENCOUNTER — Other Ambulatory Visit: Payer: Self-pay | Admitting: Internal Medicine

## 2022-11-15 DIAGNOSIS — K259 Gastric ulcer, unspecified as acute or chronic, without hemorrhage or perforation: Secondary | ICD-10-CM

## 2022-11-28 ENCOUNTER — Encounter: Payer: Self-pay | Admitting: Gastroenterology

## 2022-11-28 ENCOUNTER — Ambulatory Visit (INDEPENDENT_AMBULATORY_CARE_PROVIDER_SITE_OTHER): Payer: Self-pay | Admitting: Gastroenterology

## 2022-11-28 VITALS — BP 130/86 | HR 97 | Temp 97.4°F | Ht 69.0 in | Wt 210.8 lb

## 2022-11-28 DIAGNOSIS — R1310 Dysphagia, unspecified: Secondary | ICD-10-CM

## 2022-11-28 DIAGNOSIS — K219 Gastro-esophageal reflux disease without esophagitis: Secondary | ICD-10-CM

## 2022-11-28 DIAGNOSIS — K259 Gastric ulcer, unspecified as acute or chronic, without hemorrhage or perforation: Secondary | ICD-10-CM

## 2022-11-28 DIAGNOSIS — K2210 Ulcer of esophagus without bleeding: Secondary | ICD-10-CM

## 2022-11-28 MED ORDER — PANTOPRAZOLE SODIUM 40 MG PO TBEC: DELAYED_RELEASE_TABLET | ORAL | 1 refills | Status: DC

## 2022-11-28 NOTE — Patient Instructions (Signed)
Complete [facility] Assistance forms.  You are due for follow up upper endoscopy and colonoscopy. When you are ready to proceed, please let us know. Continue pantoprazole 40mg  once to twice daily.
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
## 2022-12-15 ENCOUNTER — Telehealth: Payer: Self-pay | Admitting: Internal Medicine

## 2022-12-15 ENCOUNTER — Other Ambulatory Visit: Payer: Self-pay | Admitting: Internal Medicine

## 2022-12-15 ENCOUNTER — Other Ambulatory Visit: Payer: Self-pay

## 2022-12-15 DIAGNOSIS — K219 Gastro-esophageal reflux disease without esophagitis: Secondary | ICD-10-CM

## 2022-12-15 DIAGNOSIS — K259 Gastric ulcer, unspecified as acute or chronic, without hemorrhage or perforation: Secondary | ICD-10-CM

## 2022-12-15 MED ORDER — PANTOPRAZOLE SODIUM 40 MG PO TBEC: DELAYED_RELEASE_TABLET | ORAL | 1 refills | Status: DC

## 2022-12-15 NOTE — Telephone Encounter (Signed)
Called in on patient behalf. [facility] [place] on [address] never received med.  Patient needs med sent in   pantoprazole (PROTONIX) 40 MG tablet

## 2023-01-07 ENCOUNTER — Other Ambulatory Visit: Payer: Self-pay | Admitting: Internal Medicine

## 2023-01-07 DIAGNOSIS — K259 Gastric ulcer, unspecified as acute or chronic, without hemorrhage or perforation: Secondary | ICD-10-CM

## 2023-01-18 ENCOUNTER — Encounter: Payer: Self-pay | Admitting: Internal Medicine

## 2023-01-18 ENCOUNTER — Ambulatory Visit (INDEPENDENT_AMBULATORY_CARE_PROVIDER_SITE_OTHER): Payer: Self-pay | Admitting: Internal Medicine

## 2023-01-18 VITALS — BP 156/98 | HR 88 | Ht 69.0 in | Wt 218.6 lb

## 2023-01-18 DIAGNOSIS — K219 Gastro-esophageal reflux disease without esophagitis: Secondary | ICD-10-CM

## 2023-01-18 DIAGNOSIS — E781 Pure hyperglyceridemia: Secondary | ICD-10-CM

## 2023-01-18 DIAGNOSIS — I10 Essential (primary) hypertension: Secondary | ICD-10-CM

## 2023-01-18 DIAGNOSIS — E782 Mixed hyperlipidemia: Secondary | ICD-10-CM

## 2023-01-18 MED ORDER — OLMESARTAN MEDOXOMIL 20 MG PO TABS: 20.0000 mg | ORAL_TABLET | Freq: Every day | ORAL | 1 refills | Status: DC

## 2023-01-18 MED ORDER — AMLODIPINE BESYLATE 5 MG PO TABS: 5.0000 mg | ORAL_TABLET | Freq: Every day | ORAL | 1 refills | Status: DC
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
## 2023-01-18 NOTE — Assessment & Plan Note (Signed)
Symptoms are currently well-controlled with Protonix 40 mg twice daily.  He has established care with gastroenterology and will undergo repeat EGD as soon as he is able to, pending insurance status.

## 2023-01-18 NOTE — Assessment & Plan Note (Signed)
BP is elevated today, 151/79 initially and 156/98 on repeat.  He is currently out of his antihypertensive medications.  He is prescribed amlodipine 5 mg daily as well as olmesartan 20 mg daily. -Amlodipine and olmesartan have been refilled.  He is able to assess his blood pressure at home.  He was instructed to record his blood pressure readings over the next 2 weeks and notify our office if BP is consistently above 130/80.

## 2023-01-18 NOTE — Assessment & Plan Note (Signed)
Mixed hyperlipidemia with moderate-severe hypertriglyceridemia noted on lipid panel from October 2023.  His 10-year ASCVD risk score today is 22%, worsened in the setting of HTN (he is currently out of antihypertensive medications). -Repeat lipid panel ordered today.  He is in agreement with starting cholesterol-lowering therapy if indicated.

## 2023-01-18 NOTE — Patient Instructions (Signed)
It was a pleasure to see you today.  Thank you for giving [place] the opportunity to be involved in your care.  Below is a brief recap of your visit and next steps.  We will plan to see you again in 6 months.  Summary Blood pressure medications have been refilled Repeat cholesterol panel Follow up in 6 months

## 2023-01-19 ENCOUNTER — Other Ambulatory Visit: Payer: Self-pay | Admitting: Internal Medicine

## 2023-01-19 DIAGNOSIS — E782 Mixed hyperlipidemia: Secondary | ICD-10-CM

## 2023-01-19 LAB — LIPID PANEL
Chol/HDL Ratio: 7.6 ratio — ABNORMAL HIGH (ref 0.0–5.0)
Cholesterol, Total: 221 mg/dL — ABNORMAL HIGH (ref 100–199)
HDL: 29 mg/dL — ABNORMAL LOW (ref >39)
LDL Chol Calc (NIH): 88 mg/dL (ref 0–99)
Triglycerides: 631 mg/dL (ref 0–149)
VLDL Cholesterol Cal: 104 mg/dL — ABNORMAL HIGH (ref 5–40)

## 2023-01-19 MED ORDER — OMEGA-3-ACID ETHYL ESTERS 1 G PO CAPS: 1.0000 g | ORAL_CAPSULE | Freq: Two times a day (BID) | ORAL | 5 refills | Status: DC

## 2023-01-19 MED ORDER — ATORVASTATIN CALCIUM 40 MG PO TABS: 40.0000 mg | ORAL_TABLET | Freq: Every day | ORAL | 3 refills | Status: DC

## 2023-02-02 ENCOUNTER — Telehealth: Payer: Self-pay | Admitting: Gastroenterology

## 2023-02-02 NOTE — Telephone Encounter (Signed)
Tammy, can you follow up with patient to see if he was able to complete [facility] assistance forms? He was reluctant to schedule TCS/EGD at time of last ov due to loss of insurance. We had concerns for possible underlying malignancy in the esophagus on EGD 08/2022 but not confirmed by biopsy. Was due for EGD 10/2022 to go back and recheck area.   EGD is more pressing than colonoscopy. But he does need both. See if he is ready to schedule EGD alone or EGD/TCS.

## 2023-02-05 NOTE — Telephone Encounter (Signed)
Noted. Thanks.

## 2023-02-05 NOTE — Telephone Encounter (Signed)
Pt states that he has not, I expressed the importance of getting them filled out and turned in. Pt verbalized understanding.

## 2023-06-13 ENCOUNTER — Other Ambulatory Visit: Payer: Self-pay | Admitting: Internal Medicine

## 2023-06-13 DIAGNOSIS — K219 Gastro-esophageal reflux disease without esophagitis: Secondary | ICD-10-CM

## 2023-07-07 ENCOUNTER — Other Ambulatory Visit: Payer: Self-pay | Admitting: Internal Medicine

## 2023-07-07 DIAGNOSIS — E782 Mixed hyperlipidemia: Secondary | ICD-10-CM

## 2023-07-07 DIAGNOSIS — I10 Essential (primary) hypertension: Secondary | ICD-10-CM

## 2023-07-12 ENCOUNTER — Other Ambulatory Visit: Payer: Self-pay

## 2023-07-12 ENCOUNTER — Encounter: Payer: Self-pay | Admitting: Internal Medicine

## 2023-07-12 DIAGNOSIS — E782 Mixed hyperlipidemia: Secondary | ICD-10-CM

## 2023-07-12 DIAGNOSIS — I10 Essential (primary) hypertension: Secondary | ICD-10-CM

## 2023-07-12 MED ORDER — OMEGA-3-ACID ETHYL ESTERS 1 G PO CAPS: 1.0000 | ORAL_CAPSULE | Freq: Two times a day (BID) | ORAL | 1 refills | Status: DC

## 2023-07-12 MED ORDER — OLMESARTAN MEDOXOMIL 20 MG PO TABS: 20.0000 mg | ORAL_TABLET | Freq: Every day | ORAL | 1 refills | Status: DC

## 2023-07-12 MED ORDER — ATORVASTATIN CALCIUM 40 MG PO TABS: 40.0000 mg | ORAL_TABLET | Freq: Every day | ORAL | 3 refills | Status: DC

## 2023-07-12 MED ORDER — AMLODIPINE BESYLATE 5 MG PO TABS: 5.0000 mg | ORAL_TABLET | Freq: Every day | ORAL | 1 refills | Status: DC

## 2023-07-20 ENCOUNTER — Ambulatory Visit: Payer: Self-pay | Admitting: Internal Medicine

## 2023-08-27 ENCOUNTER — Encounter: Payer: Self-pay | Admitting: Internal Medicine

## 2023-08-27 ENCOUNTER — Ambulatory Visit (INDEPENDENT_AMBULATORY_CARE_PROVIDER_SITE_OTHER): Payer: Self-pay | Admitting: Internal Medicine

## 2023-08-27 VITALS — BP 114/78 | HR 83 | Ht 69.0 in | Wt 215.8 lb

## 2023-08-27 DIAGNOSIS — K219 Gastro-esophageal reflux disease without esophagitis: Secondary | ICD-10-CM

## 2023-08-27 DIAGNOSIS — I10 Essential (primary) hypertension: Secondary | ICD-10-CM

## 2023-08-27 DIAGNOSIS — Z5320 Procedure and treatment not carried out because of patient's decision for unspecified reasons: Secondary | ICD-10-CM

## 2023-08-27 DIAGNOSIS — E782 Mixed hyperlipidemia: Secondary | ICD-10-CM

## 2023-08-27 DIAGNOSIS — Z2821 Immunization not carried out because of patient refusal: Secondary | ICD-10-CM
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
## 2023-08-27 NOTE — Patient Instructions (Signed)
It was a pleasure to see you today.  Thank you for giving [place] the opportunity to be involved in your care.  Below is a brief recap of your visit and next steps.  We will plan to see you again in 6 months.  Summary No medication changes today Repeat cholesterol panel Follow up in 6 months

## 2023-08-28 ENCOUNTER — Encounter: Payer: Self-pay | Admitting: Internal Medicine

## 2023-08-28 ENCOUNTER — Other Ambulatory Visit: Payer: Self-pay | Admitting: Internal Medicine

## 2023-08-28 DIAGNOSIS — E782 Mixed hyperlipidemia: Secondary | ICD-10-CM

## 2023-08-28 LAB — LIPID PANEL
Chol/HDL Ratio: 6.9 {ratio} — ABNORMAL HIGH (ref 0.0–5.0)
Cholesterol, Total: 179 mg/dL (ref 100–199)
HDL: 26 mg/dL — ABNORMAL LOW (ref >39)
LDL Chol Calc (NIH): 51 mg/dL (ref 0–99)
Triglycerides: 701 mg/dL (ref 0–149)
VLDL Cholesterol Cal: 102 mg/dL — ABNORMAL HIGH (ref 5–40)

## 2023-08-28 MED ORDER — OMEGA-3-ACID ETHYL ESTERS 1 G PO CAPS: 2.0000 g | ORAL_CAPSULE | Freq: Two times a day (BID) | ORAL | Status: DC

## 2023-09-11 NOTE — Assessment & Plan Note (Signed)
Lipid panel updated in February.  Total cholesterol 221, LDL 88, and TGs 631.  Atorvastatin 40 mg daily and Lovaza 1 g twice daily or started in light of this result.  He has not experienced any adverse side effects. -Repeat lipid panel ordered today.  Further medication titration pending result.

## 2023-09-11 NOTE — Assessment & Plan Note (Signed)
 Remains adequately controlled on current antihypertensive regimen.  No medication changes are indicated today.

## 2023-09-11 NOTE — Assessment & Plan Note (Signed)
Symptoms remain well-controlled with Protonix 40 mg twice daily.  No medication changes are indicated today.

## 2023-09-20 MED ORDER — OMEGA-3-ACID ETHYL ESTERS 1 G PO CAPS: 2.0000 g | ORAL_CAPSULE | Freq: Two times a day (BID) | ORAL | 1 refills | Status: DC

## 2023-10-04 ENCOUNTER — Other Ambulatory Visit: Payer: Self-pay

## 2023-10-04 DIAGNOSIS — I10 Essential (primary) hypertension: Secondary | ICD-10-CM

## 2023-10-04 MED ORDER — OLMESARTAN MEDOXOMIL 20 MG PO TABS: 20.0000 mg | ORAL_TABLET | Freq: Every day | ORAL | 1 refills | Status: DC

## 2024-01-29 ENCOUNTER — Encounter: Payer: Self-pay | Admitting: Internal Medicine

## 2024-01-31 NOTE — Telephone Encounter (Signed)
Note added to next appt.

## 2024-02-19 ENCOUNTER — Ambulatory Visit: Payer: Self-pay | Admitting: Internal Medicine

## 2024-02-19 ENCOUNTER — Encounter: Payer: Self-pay | Admitting: Internal Medicine

## 2024-02-19 VITALS — BP 137/78 | HR 82 | Ht 69.0 in | Wt 219.0 lb

## 2024-02-19 DIAGNOSIS — K219 Gastro-esophageal reflux disease without esophagitis: Secondary | ICD-10-CM

## 2024-02-19 DIAGNOSIS — E66811 Obesity, class 1: Secondary | ICD-10-CM | POA: Diagnosis not present

## 2024-02-19 DIAGNOSIS — I10 Essential (primary) hypertension: Secondary | ICD-10-CM

## 2024-02-19 DIAGNOSIS — R7303 Prediabetes: Secondary | ICD-10-CM

## 2024-02-19 DIAGNOSIS — E782 Mixed hyperlipidemia: Secondary | ICD-10-CM

## 2024-02-19 NOTE — Assessment & Plan Note (Addendum)
 Remains adequately controlled controlled with Amlodipine 5 mg and Olmesartan 20 mg daily. No medication changes are indicated today.
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
## 2024-02-19 NOTE — Patient Instructions (Signed)
 It was a pleasure to see you today.  Thank you for giving [place] the opportunity to be involved in your care.  Below is a brief recap of your visit and next steps.  We will plan to see you again in 6 months.  Summary No medication changes today Repeat labs ordered Follow up in 6 months

## 2024-02-19 NOTE — Assessment & Plan Note (Signed)
 Symptoms remain controlled on Protonix 40 mg twice daily. No changes today.

## 2024-02-19 NOTE — Assessment & Plan Note (Addendum)
 Lipid panel last updated 10/7 showed improvement in total cholesterol and LDL, now at goal on Atorvastatin 40 mg. TGs remained significantly elevated. Lovaza was increased to 2g twice daily as a result.  -Repeat lipid panel ordered today.  Further management pending results.

## 2024-02-20 ENCOUNTER — Other Ambulatory Visit: Payer: Self-pay | Admitting: Internal Medicine

## 2024-02-20 ENCOUNTER — Telehealth: Payer: Self-pay | Admitting: Internal Medicine

## 2024-02-20 ENCOUNTER — Encounter: Payer: Self-pay | Admitting: Internal Medicine

## 2024-02-20 DIAGNOSIS — E119 Type 2 diabetes mellitus without complications: Secondary | ICD-10-CM

## 2024-02-20 LAB — CMP14+EGFR
ALT: 27 IU/L (ref 0–44)
AST: 18 IU/L (ref 0–40)
Albumin: 4.9 g/dL (ref 3.9–4.9)
Alkaline Phosphatase: 66 IU/L (ref 44–121)
BUN/Creatinine Ratio: 20 (ref 10–24)
BUN: 20 mg/dL (ref 8–27)
Bilirubin Total: 1.1 mg/dL (ref 0.0–1.2)
CO2: 21 mmol/L (ref 20–29)
Calcium: 9.8 mg/dL (ref 8.6–10.2)
Chloride: 99 mmol/L (ref 96–106)
Creatinine, Ser: 1.02 mg/dL (ref 0.76–1.27)
Globulin, Total: 2.8 g/dL (ref 1.5–4.5)
Glucose: 244 mg/dL — ABNORMAL HIGH (ref 70–99)
Potassium: 4.3 mmol/L (ref 3.5–5.2)
Sodium: 136 mmol/L (ref 134–144)
Total Protein: 7.7 g/dL (ref 6.0–8.5)
eGFR: 83 mL/min/{1.73_m2} (ref >59)

## 2024-02-20 LAB — B12 AND FOLATE PANEL
Folate: 4.8 ng/mL (ref >3.0)
Vitamin B-12: 727 pg/mL (ref 232–1245)

## 2024-02-20 LAB — HEMOGLOBIN A1C
Est. average glucose Bld gHb Est-mCnc: 192 mg/dL
Hgb A1c MFr Bld: 8.3 % — ABNORMAL HIGH (ref 4.8–5.6)

## 2024-02-20 LAB — TSH+FREE T4
Free T4: 1.24 ng/dL (ref 0.82–1.77)
TSH: 2.28 u[IU]/mL (ref 0.450–4.500)

## 2024-02-20 LAB — LIPID PANEL
Chol/HDL Ratio: 6.6 ratio — ABNORMAL HIGH (ref 0.0–5.0)
Cholesterol, Total: 205 mg/dL — ABNORMAL HIGH (ref 100–199)
HDL: 31 mg/dL — ABNORMAL LOW (ref >39)
LDL Chol Calc (NIH): 88 mg/dL (ref 0–99)
Triglycerides: 526 mg/dL — ABNORMAL HIGH (ref 0–149)
VLDL Cholesterol Cal: 86 mg/dL — ABNORMAL HIGH (ref 5–40)

## 2024-02-20 LAB — CBC WITH DIFFERENTIAL/PLATELET
Basophils Absolute: 0.1 10*3/uL (ref 0.0–0.2)
Basos: 1 %
EOS (ABSOLUTE): 0.3 10*3/uL (ref 0.0–0.4)
Eos: 5 %
Hematocrit: 44.1 % (ref 37.5–51.0)
Hemoglobin: 14.7 g/dL (ref 13.0–17.7)
Immature Grans (Abs): 0 10*3/uL (ref 0.0–0.1)
Immature Granulocytes: 0 %
Lymphocytes Absolute: 1.6 10*3/uL (ref 0.7–3.1)
Lymphs: 35 %
MCH: 29.3 pg (ref 26.6–33.0)
MCHC: 33.3 g/dL (ref 31.5–35.7)
MCV: 88 fL (ref 79–97)
Monocytes Absolute: 0.3 10*3/uL (ref 0.1–0.9)
Monocytes: 7 %
Neutrophils Absolute: 2.4 10*3/uL (ref 1.4–7.0)
Neutrophils: 52 %
Platelets: 215 10*3/uL (ref 150–450)
RBC: 5.02 x10E6/uL (ref 4.14–5.80)
RDW: 12.9 % (ref 11.6–15.4)
WBC: 4.6 10*3/uL (ref 3.4–10.8)

## 2024-02-20 LAB — VITAMIN D 25 HYDROXY (VIT D DEFICIENCY, FRACTURES): Vit D, 25-Hydroxy: 36.5 ng/mL (ref 30.0–100.0)

## 2024-02-20 MED ORDER — METFORMIN HCL ER 500 MG PO TB24: ORAL_TABLET | ORAL | 0 refills | Status: DC

## 2024-02-20 NOTE — Telephone Encounter (Signed)
 LVM for pt to get him scheduled within the next 2-4 wks per [place]. York Spaniel he will check his messages when he gets off work and see if he needs to come in.

## 2024-03-11 ENCOUNTER — Other Ambulatory Visit: Payer: Self-pay | Admitting: Internal Medicine

## 2024-03-11 DIAGNOSIS — E119 Type 2 diabetes mellitus without complications: Secondary | ICD-10-CM

## 2024-03-24 ENCOUNTER — Ambulatory Visit: Admitting: Internal Medicine

## 2024-04-05 ENCOUNTER — Other Ambulatory Visit: Payer: Self-pay | Admitting: Internal Medicine

## 2024-04-05 DIAGNOSIS — K219 Gastro-esophageal reflux disease without esophagitis: Secondary | ICD-10-CM

## 2024-04-15 ENCOUNTER — Other Ambulatory Visit: Payer: Self-pay

## 2024-04-15 ENCOUNTER — Encounter: Payer: Self-pay | Admitting: Internal Medicine

## 2024-04-15 DIAGNOSIS — I10 Essential (primary) hypertension: Secondary | ICD-10-CM

## 2024-04-15 MED ORDER — AMLODIPINE BESYLATE 5 MG PO TABS: 5.0000 mg | ORAL_TABLET | Freq: Every day | ORAL | 1 refills | Status: DC

## 2024-04-15 NOTE — Telephone Encounter (Signed)
 Refills sent

## 2024-08-21 ENCOUNTER — Other Ambulatory Visit: Payer: Self-pay

## 2024-08-21 DIAGNOSIS — E119 Type 2 diabetes mellitus without complications: Secondary | ICD-10-CM

## 2024-08-21 MED ORDER — METFORMIN HCL ER 500 MG PO TB24: ORAL_TABLET | ORAL | 1 refills | Status: DC

## 2024-08-22 ENCOUNTER — Encounter

## 2024-09-03 ENCOUNTER — Encounter (INDEPENDENT_AMBULATORY_CARE_PROVIDER_SITE_OTHER): Payer: Self-pay | Admitting: Gastroenterology

## 2024-10-03 ENCOUNTER — Other Ambulatory Visit: Payer: Self-pay

## 2024-10-03 DIAGNOSIS — I10 Essential (primary) hypertension: Secondary | ICD-10-CM

## 2024-10-03 MED ORDER — OLMESARTAN MEDOXOMIL 20 MG PO TABS: 20.0000 mg | ORAL_TABLET | Freq: Every day | ORAL | 1 refills | Status: DC

## 2024-10-04 ENCOUNTER — Other Ambulatory Visit: Payer: Self-pay | Admitting: Internal Medicine

## 2024-10-04 DIAGNOSIS — K219 Gastro-esophageal reflux disease without esophagitis: Secondary | ICD-10-CM

## 2024-10-08 ENCOUNTER — Other Ambulatory Visit: Payer: Self-pay | Admitting: Internal Medicine

## 2024-10-08 DIAGNOSIS — I10 Essential (primary) hypertension: Secondary | ICD-10-CM

## 2024-10-10 ENCOUNTER — Other Ambulatory Visit: Payer: Self-pay

## 2024-10-10 DIAGNOSIS — I10 Essential (primary) hypertension: Secondary | ICD-10-CM

## 2024-10-10 DIAGNOSIS — E782 Mixed hyperlipidemia: Secondary | ICD-10-CM

## 2024-10-10 MED ORDER — AMLODIPINE BESYLATE 5 MG PO TABS: 5.0000 mg | ORAL_TABLET | Freq: Every day | ORAL | 1 refills | Status: DC

## 2024-10-10 MED ORDER — ATORVASTATIN CALCIUM 40 MG PO TABS: 40.0000 mg | ORAL_TABLET | Freq: Every day | ORAL | 3 refills | Status: DC

## 2024-10-10 NOTE — Telephone Encounter (Signed)
 Good Morning, refills have been sent to your pharmacy

## 2024-11-07 ENCOUNTER — Other Ambulatory Visit: Payer: Self-pay

## 2024-11-07 DIAGNOSIS — E119 Type 2 diabetes mellitus without complications: Secondary | ICD-10-CM

## 2024-11-11 DIAGNOSIS — E119 Type 2 diabetes mellitus without complications: Secondary | ICD-10-CM

## 2024-11-11 MED ORDER — METFORMIN HCL ER 500 MG PO TB24: 1000.0000 mg | ORAL_TABLET | Freq: Two times a day (BID) | ORAL | 2 refills | Status: DC

## 2024-11-17 ENCOUNTER — Other Ambulatory Visit: Payer: Self-pay

## 2024-11-17 DIAGNOSIS — E119 Type 2 diabetes mellitus without complications: Secondary | ICD-10-CM

## 2024-12-10 ENCOUNTER — Ambulatory Visit

## 2024-12-10 VITALS — BP 157/96 | HR 83 | Ht 69.0 in | Wt 219.0 lb

## 2024-12-10 DIAGNOSIS — K219 Gastro-esophageal reflux disease without esophagitis: Secondary | ICD-10-CM | POA: Diagnosis not present

## 2024-12-10 DIAGNOSIS — E119 Type 2 diabetes mellitus without complications: Secondary | ICD-10-CM | POA: Diagnosis not present

## 2024-12-10 DIAGNOSIS — Z7984 Long term (current) use of oral hypoglycemic drugs: Secondary | ICD-10-CM

## 2024-12-10 DIAGNOSIS — R7303 Prediabetes: Secondary | ICD-10-CM

## 2024-12-10 DIAGNOSIS — E782 Mixed hyperlipidemia: Secondary | ICD-10-CM

## 2024-12-10 DIAGNOSIS — Z125 Encounter for screening for malignant neoplasm of prostate: Secondary | ICD-10-CM

## 2024-12-10 DIAGNOSIS — I10 Essential (primary) hypertension: Secondary | ICD-10-CM

## 2024-12-10 DIAGNOSIS — Z0000 Encounter for general adult medical examination without abnormal findings: Secondary | ICD-10-CM

## 2024-12-10 DIAGNOSIS — Z1211 Encounter for screening for malignant neoplasm of colon: Secondary | ICD-10-CM

## 2024-12-10 DIAGNOSIS — Z0001 Encounter for general adult medical examination with abnormal findings: Secondary | ICD-10-CM | POA: Diagnosis not present

## 2024-12-10 MED ORDER — PANTOPRAZOLE SODIUM 40 MG PO TBEC: DELAYED_RELEASE_TABLET | ORAL | 3 refills | Status: AC

## 2024-12-10 MED ORDER — AMLODIPINE BESYLATE 5 MG PO TABS: 5.0000 mg | ORAL_TABLET | Freq: Every day | ORAL | 3 refills | Status: AC

## 2024-12-10 MED ORDER — OLMESARTAN MEDOXOMIL 20 MG PO TABS: 20.0000 mg | ORAL_TABLET | Freq: Every day | ORAL | 3 refills | Status: AC

## 2024-12-10 MED ORDER — ATORVASTATIN CALCIUM 40 MG PO TABS: 40.0000 mg | ORAL_TABLET | Freq: Every day | ORAL | 3 refills | Status: AC

## 2024-12-10 MED ORDER — METFORMIN HCL ER 500 MG PO TB24: 1000.0000 mg | ORAL_TABLET | Freq: Two times a day (BID) | ORAL | 2 refills | Status: AC

## 2024-12-10 NOTE — Progress Notes (Signed)
 "  Established Patient Office Visit  Subjective   Patient ID: Patrick Wood, male    DOB: 1961-06-01  Age: 64 y.o. MRN: [id number]  Chief Complaint  Patient presents with   Annual Exam    HPI Discussed the use of AI scribe software for clinical note transcription with the patient, who gave verbal consent to proceed.  History of Present Illness    Patrick Wood is a 64 year old male who presents for an annual physical exam and medication review.  Medication management and refill issues - Difficulty obtaining automatic refills for medications at [facility] due to Medicaid restrictions, resulting in running low on medications despite available refills - Currently taking medications for hypertension, hyperlipidemia, and diabetes - Compliant with prescribed medication regimen - Discontinued omega-3 supplements due to inconvenience with the refill process  Hypertension, hyperlipidemia, and diabetes mellitus - No headaches or chest pain - Continues prescribed medications for these conditions  Gastroesophageal reflux disease and esophageal ulcer history - History of esophageal ulcer with biopsy performed over a year ago; biopsy was negative for malignancy - Persistent cough localized to the area of the previous esophageal sore, onset immediately after the procedure and ongoing since then - Manages cough with Hall's throat lozenges - Experiences acid reflux if eating after 2 PM; manages symptoms by avoiding food after this time and taking Protonix  once daily in the morning     Patient Active Problem List   Diagnosis Date Noted   Type 2 diabetes mellitus without complication, without long-term current use of insulin (HCC) 12/15/2024   Esophageal ulcer 11/28/2022   Hyperlipidemia 10/19/2022   Prediabetes 10/19/2022   Screening for colorectal cancer 10/19/2022   Multiple gastric ulcers 09/20/2022   Encounter for well adult exam with abnormal findings 09/20/2022   Abnormal CT scan,  esophagus    Persistent headaches 09/05/2022   Hypercalcemia 09/05/2022   Essential hypertension 08/03/2022   Chronic hepatitis C without hepatic coma (HCC) 08/28/2018   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  Constitutional:      Appearance: Normal appearance. He is obese.  HENT:     Head: Normocephalic.     Right Ear: Tympanic membrane, ear canal and external ear normal.     Left Ear: Tympanic membrane, ear canal and external ear normal.     Nose: Nose normal.     Mouth/Throat:     Mouth: Mucous membranes are moist.     Pharynx: Oropharynx is clear.  Eyes:     Extraocular Movements: Extraocular movements intact.     Pupils: Pupils are equal, round, and reactive to light.  Cardiovascular:     Rate and Rhythm: Normal rate and regular rhythm.  Pulmonary:  Effort: Pulmonary effort is normal.     Breath sounds: Normal breath sounds.  Abdominal:     General: Abdomen is flat. Bowel sounds are normal.     Palpations: Abdomen is soft.  Musculoskeletal:        General: Normal range of motion.     Cervical back: Normal range of motion and neck supple.  Skin:    General: Skin is warm and dry.  Neurological:     Mental Status: He is alert and oriented to person, place, and time.  Psychiatric:        Mood and Affect: Mood normal.        Thought  Content: Thought content normal.     Results for orders placed or performed in visit on 12/10/24  Hemoglobin A1c  Result Value Ref Range   Hgb A1c MFr Bld 7.1 (H) 4.8 - 5.6 %   Est. average glucose Bld gHb Est-mCnc 157 mg/dL  Lipid panel  Result Value Ref Range   Cholesterol, Total 154 100 - 199 mg/dL   Triglycerides 537 (H) 0 - 149 mg/dL   HDL 29 (L) >60 mg/dL   VLDL Cholesterol Cal 70 (H) 5 - 40 mg/dL   LDL Chol Calc (NIH) 55 0 - 99 mg/dL   Chol/HDL Ratio 5.3 (H) 0.0 - 5.0 ratio  CMP14+EGFR  Result Value Ref Range   Glucose 152 (H) 70 - 99 mg/dL   BUN 18 8 - 27 mg/dL   Creatinine, Ser 8.98 0.76 - 1.27 mg/dL   eGFR 84 >40 fO/fpw/8.26   BUN/Creatinine Ratio 18 10 - 24   Sodium 139 134 - 144 mmol/L   Potassium 4.1 3.5 - 5.2 mmol/L   Chloride 101 96 - 106 mmol/L   CO2 22 20 - 29 mmol/L   Calcium  9.9 8.6 - 10.2 mg/dL   Total Protein 7.4 6.0 - 8.5 g/dL   Albumin 4.9 3.9 - 4.9 g/dL   Globulin, Total 2.5 1.5 - 4.5 g/dL   Bilirubin Total 1.0 0.0 - 1.2 mg/dL   Alkaline Phosphatase 57 47 - 123 IU/L   AST 14 0 - 40 IU/L   ALT 26 0 - 44 IU/L  CBC with Differential/Platelet  Result Value Ref Range   WBC 5.3 3.4 - 10.8 x10E3/uL   RBC 5.12 4.14 - 5.80 x10E6/uL   Hemoglobin 14.7 13.0 - 17.7 g/dL   Hematocrit 54.8 62.4 - 51.0 %   MCV 88 79 - 97 fL   MCH 28.7 26.6 - 33.0 pg   MCHC 32.6 31.5 - 35.7 g/dL   RDW 86.9 88.3 - 84.5 %   Platelets 248 150 - 450 x10E3/uL   Neutrophils 51 Not Estab. %   Lymphs 33 Not Estab. %   Monocytes 8 Not Estab. %   Eos 7 Not Estab. %   Basos 1 Not Estab. %   Neutrophils Absolute 2.7 1.4 - 7.0 x10E3/uL   Lymphocytes Absolute 1.8 0.7 - 3.1 x10E3/uL   Monocytes Absolute 0.4 0.1 - 0.9 x10E3/uL   EOS (ABSOLUTE) 0.4 0.0 - 0.4 x10E3/uL   Basophils Absolute 0.1 0.0 - 0.2 x10E3/uL   Immature Granulocytes 0 Not Estab. %   Immature Grans (Abs) 0.0 0.0 - 0.1 x10E3/uL  B12 and Folate Panel  Result Value Ref Range   Vitamin B-12 716 232 - 1,245 pg/mL    Folate 10.9 >3.0 ng/mL  Vitamin D  (25 hydroxy)  Result Value Ref Range   Vit D, 25-Hydroxy 29.7 (L) 30.0 - 100.0  ng/mL  PSA  Result Value Ref Range   Prostate Specific Ag, Serum 5.2 (H) 0.0 - 4.0 ng/mL      The 10-year ASCVD risk score (Arnett DK, et al., 2019) is: 35.5%    Assessment & Plan:   Problem List Items Addressed This Visit       Cardiovascular and Mediastinum   Essential hypertension   Blood pressure improved to 110/74 mmHg upon recheck. No symptoms of headaches or chest pain reported. - Continue current antihypertensive medication regimen.      Relevant Medications   amLODipine  (NORVASC ) 5 MG tablet   atorvastatin  (LIPITOR) 40 MG tablet   olmesartan  (BENICAR ) 20 MG tablet   Other Relevant Orders   CMP14+EGFR (Completed)   CBC with Differential/Platelet (Completed)     Digestive   Gastroesophageal reflux disease without esophagitis   Persistent cough since esophageal procedure. Cough localized to area of previous esophageal sore. Continues Protonix  daily and avoids eating after 2 PM. - Referred to ENT for evaluation of persistent cough. - Continue Protonix  daily.      Relevant Medications   pantoprazole  (PROTONIX ) 40 MG tablet   Other Relevant Orders   CMP14+EGFR (Completed)   CBC with Differential/Platelet (Completed)     Endocrine   Type 2 diabetes mellitus without complication, without long-term current use of insulin (HCC)   - Continue current diabetes management regimen. Recheck labs today.       Relevant Medications   atorvastatin  (LIPITOR) 40 MG tablet   metFORMIN  (GLUCOPHAGE -XR) 500 MG 24 hr tablet   olmesartan  (BENICAR ) 20 MG tablet     Other   Hyperlipidemia   Managed with Atorvastatin  40 mg and Lovaza  2g twice daily.   -Repeat lipid panel ordered today.  Further management pending results.      Relevant Medications   amLODipine  (NORVASC ) 5 MG tablet   atorvastatin  (LIPITOR) 40 MG tablet   olmesartan  (BENICAR ) 20 MG tablet   Other  Relevant Orders   Lipid panel (Completed)   CMP14+EGFR (Completed)   Prediabetes   A1c 8.3 on labs from 02/19/24.  Recheck A1C levels today.          Relevant Orders   Hemoglobin A1c (Completed)   CMP14+EGFR (Completed)   Other Visit Diagnoses       Encounter for preventative adult health care examination    -  Primary   Relevant Orders   Hemoglobin A1c (Completed)   Lipid panel (Completed)   CMP14+EGFR (Completed)   CBC with Differential/Platelet (Completed)   B12 and Folate Panel (Completed)   Vitamin D  (25 hydroxy) (Completed)     Screening for colon cancer       Relevant Orders   Cologuard     Prostate cancer screening       Relevant Orders   PSA (Completed)         The natural history of prostate cancer and ongoing controversy regarding screening and potential treatment outcomes of prostate cancer has been discussed with the patient. The meaning of a false positive PSA and a false negative PSA has been discussed. He indicates understanding of the limitations of this screening test and wishes to proceed with screening PSA testing.  Return in about 6 months (around 06/09/2025) for chronic follow-up with PCP.    Leita Longs, FNP  "

## 2024-12-11 LAB — CBC WITH DIFFERENTIAL/PLATELET
Basophils Absolute: 0.1 x10E3/uL (ref 0.0–0.2)
Basos: 1 %
EOS (ABSOLUTE): 0.4 x10E3/uL (ref 0.0–0.4)
Eos: 7 %
Hematocrit: 45.1 % (ref 37.5–51.0)
Hemoglobin: 14.7 g/dL (ref 13.0–17.7)
Immature Grans (Abs): 0 x10E3/uL (ref 0.0–0.1)
Immature Granulocytes: 0 %
Lymphocytes Absolute: 1.8 x10E3/uL (ref 0.7–3.1)
Lymphs: 33 %
MCH: 28.7 pg (ref 26.6–33.0)
MCHC: 32.6 g/dL (ref 31.5–35.7)
MCV: 88 fL (ref 79–97)
Monocytes Absolute: 0.4 x10E3/uL (ref 0.1–0.9)
Monocytes: 8 %
Neutrophils Absolute: 2.7 x10E3/uL (ref 1.4–7.0)
Neutrophils: 51 %
Platelets: 248 x10E3/uL (ref 150–450)
RBC: 5.12 x10E6/uL (ref 4.14–5.80)
RDW: 13 % (ref 11.6–15.4)
WBC: 5.3 x10E3/uL (ref 3.4–10.8)

## 2024-12-11 LAB — CMP14+EGFR
ALT: 26 IU/L (ref 0–44)
AST: 14 IU/L (ref 0–40)
Albumin: 4.9 g/dL (ref 3.9–4.9)
Alkaline Phosphatase: 57 IU/L (ref 47–123)
BUN/Creatinine Ratio: 18 (ref 10–24)
BUN: 18 mg/dL (ref 8–27)
Bilirubin Total: 1 mg/dL (ref 0.0–1.2)
CO2: 22 mmol/L (ref 20–29)
Calcium: 9.9 mg/dL (ref 8.6–10.2)
Chloride: 101 mmol/L (ref 96–106)
Creatinine, Ser: 1.01 mg/dL (ref 0.76–1.27)
Globulin, Total: 2.5 g/dL (ref 1.5–4.5)
Glucose: 152 mg/dL — ABNORMAL HIGH (ref 70–99)
Potassium: 4.1 mmol/L (ref 3.5–5.2)
Sodium: 139 mmol/L (ref 134–144)
Total Protein: 7.4 g/dL (ref 6.0–8.5)
eGFR: 84 mL/min/1.73

## 2024-12-11 LAB — HEMOGLOBIN A1C
Est. average glucose Bld gHb Est-mCnc: 157 mg/dL
Hgb A1c MFr Bld: 7.1 % — ABNORMAL HIGH (ref 4.8–5.6)

## 2024-12-11 LAB — LIPID PANEL
Chol/HDL Ratio: 5.3 ratio — ABNORMAL HIGH (ref 0.0–5.0)
Cholesterol, Total: 154 mg/dL (ref 100–199)
HDL: 29 mg/dL — ABNORMAL LOW
LDL Chol Calc (NIH): 55 mg/dL (ref 0–99)
Triglycerides: 462 mg/dL — ABNORMAL HIGH (ref 0–149)
VLDL Cholesterol Cal: 70 mg/dL — ABNORMAL HIGH (ref 5–40)

## 2024-12-11 LAB — B12 AND FOLATE PANEL
Folate: 10.9 ng/mL
Vitamin B-12: 716 pg/mL (ref 232–1245)

## 2024-12-11 LAB — VITAMIN D 25 HYDROXY (VIT D DEFICIENCY, FRACTURES): Vit D, 25-Hydroxy: 29.7 ng/mL — ABNORMAL LOW (ref 30.0–100.0)

## 2024-12-11 LAB — PSA: Prostate Specific Ag, Serum: 5.2 ng/mL — ABNORMAL HIGH (ref 0.0–4.0)

## 2024-12-15 ENCOUNTER — Ambulatory Visit: Payer: Self-pay

## 2024-12-15 DIAGNOSIS — E119 Type 2 diabetes mellitus without complications: Secondary | ICD-10-CM | POA: Insufficient documentation

## 2024-12-15 NOTE — Assessment & Plan Note (Signed)
 Persistent cough since esophageal procedure. Cough localized to area of previous esophageal sore. Continues Protonix  daily and avoids eating after 2 PM. - Referred to ENT for evaluation of persistent cough. - Continue Protonix  daily.

## 2024-12-15 NOTE — Assessment & Plan Note (Addendum)
-   Continue current diabetes management regimen. Recheck labs today.

## 2024-12-15 NOTE — Assessment & Plan Note (Signed)
 Managed with Atorvastatin  40 mg and Lovaza  2g twice daily.   -Repeat lipid panel ordered today.  Further management pending results.

## 2024-12-15 NOTE — Assessment & Plan Note (Signed)
 Blood pressure improved to 110/74 mmHg upon recheck. No symptoms of headaches or chest pain reported. - Continue current antihypertensive medication regimen.

## 2024-12-15 NOTE — Assessment & Plan Note (Signed)
 A1c 8.3 on labs from 02/19/24.  Recheck A1C levels today.

## 2025-02-10 ENCOUNTER — Encounter: Admitting: Physician Assistant
